# Patient Record
Sex: Female | Born: 1970 | Race: Black or African American | Hispanic: No | Marital: Single | Smoking: Former smoker
Health system: Southern US, Community
[De-identification: ages and names within clinical notes are randomized; demographics above are authoritative.]

## PROBLEM LIST (undated history)

## (undated) DIAGNOSIS — I10 Essential (primary) hypertension: Secondary | ICD-10-CM

## (undated) DIAGNOSIS — M069 Rheumatoid arthritis, unspecified: Secondary | ICD-10-CM

## (undated) HISTORY — DX: Rheumatoid arthritis, unspecified: M06.9

---

## 2011-04-21 ENCOUNTER — Emergency Department (INDEPENDENT_AMBULATORY_CARE_PROVIDER_SITE_OTHER): Payer: Self-pay

## 2011-04-21 ENCOUNTER — Emergency Department (HOSPITAL_BASED_OUTPATIENT_CLINIC_OR_DEPARTMENT_OTHER)
Admission: EM | Admit: 2011-04-21 | Discharge: 2011-04-21 | Disposition: A | Discharge status: Home / Self Care | Payer: Self-pay | Attending: Emergency Medicine | Admitting: Emergency Medicine

## 2011-04-21 ENCOUNTER — Encounter (HOSPITAL_BASED_OUTPATIENT_CLINIC_OR_DEPARTMENT_OTHER): Payer: Self-pay | Admitting: *Deleted

## 2011-04-21 DIAGNOSIS — M25579 Pain in unspecified ankle and joints of unspecified foot: Secondary | ICD-10-CM | POA: Insufficient documentation

## 2011-04-21 DIAGNOSIS — M6740 Ganglion, unspecified site: Secondary | ICD-10-CM | POA: Insufficient documentation

## 2011-04-21 DIAGNOSIS — M79609 Pain in unspecified limb: Secondary | ICD-10-CM

## 2011-04-21 DIAGNOSIS — M7989 Other specified soft tissue disorders: Secondary | ICD-10-CM

## 2011-04-21 HISTORY — DX: Essential (primary) hypertension: I10

## 2011-04-21 NOTE — ED Provider Notes (Signed)
History   This chart was scribed for Raeford Razor, MD by Melba Coon. The patient was seen in room [facility] and the patient's care was started at 3:53PM.    CSN: [id number]  Arrival date & time 04/21/11  1520   First MD Initiated Contact with Patient 04/21/11 1550      Chief Complaint  Patient presents with  . Foot Pain    (Consider location/radiation/quality/duration/timing/severity/associated sxs/prior treatment) HPI Jeanette Potts is a 41 y.o. female who presents to the Emergency Department complaining of constant, moderate to severe right foot pain pertaining to a developing mass. Knot on left foot has been there for a year; knot on right foot has also developed but last night was tender. Noticed when she was lotioning her foot. Feet have not undergone collision or hard contact. Pt hasn't seen a podiatrist or PCP for feet. No fever, chills or rash. Hx of HTN.   Past Medical History  Diagnosis Date  . Hypertension     History reviewed. No pertinent past surgical history.  History reviewed. No pertinent family history.  History  Substance Use Topics  . Smoking status: Current Everyday Smoker  . Smokeless tobacco: Not on file  . Alcohol Use: No    OB History    Grav Para Term Preterm Abortions TAB SAB Ect Mult Living                  Review of Systems 10 Systems reviewed and are negative for acute change except as noted in the HPI.  Allergies  Review of patient's allergies indicates no known allergies.  Home Medications   Current Outpatient Rx  Name Route Sig Dispense Refill  . ENALAPRIL MALEATE 10 MG PO TABS Oral Take 10 mg by mouth daily.      BP 152/95  Pulse 94  Temp(Src) 99.3 F (37.4 C) (Oral)  Resp 16  Ht 5\' 2"  (1.575 m)  Wt 150 lb (68.04 kg)  BMI 27.44 kg/m2  SpO2 100%  Physical Exam  Nursing note and vitals reviewed. Constitutional: She appears well-developed and well-nourished.       Awake, alert, nontoxic appearance.  HENT:    Head: Normocephalic and atraumatic.  Eyes: Conjunctivae and EOM are normal. Pupils are equal, round, and reactive to light. Right eye exhibits no discharge. Left eye exhibits no discharge.  Neck: Normal range of motion. Neck supple.  Cardiovascular: Normal rate, regular rhythm and normal heart sounds.   Pulmonary/Chest: Effort normal. She has wheezes (expiratory). She exhibits no tenderness.  Abdominal: Soft. There is no tenderness. There is no rebound.  Musculoskeletal: She exhibits no tenderness.       Baseline ROM, no obvious new focal weakness.  Neurological:       Mental status and motor strength appears baseline for patient and situation.  Skin: Skin is warm. No rash noted.       Dorsum of rt foot: 1 cm, mildly tender, soft, subcutaneous mass, not mobile, no overlying skin change, good DP pulse.   Arch of lft foot: 1 cm, non-tender, soft, subcutaneous mass, no overlying skin changes; consistent of ganglion cyst; neurovascularaly intact distally  Psychiatric: She has a normal mood and affect.    ED Course  Procedures (including critical care time) DIAGNOSTIC STUDIES: Oxygen Saturation is 100% on room air, normal by my interpretation.    COORDINATION OF CARE:  3:56PM - EDMD converses with pt that left foot likely has a ganglion cyst that is benign and not lethal; referral to  surgeon if needed; also EDMD gave smoking cessation talk.  Labs Reviewed - No data to display Dg Foot Complete Right  04/21/2011  *RADIOLOGY REPORT*  Clinical Data: Dorsal foot pain, no trauma  RIGHT FOOT COMPLETE - 3+ VIEW  Comparison: None.  Findings: No fracture or dislocation.  No radiopaque foreign body. There is a suggestion of a periarticular erosion at the head of the first metatarsal with overhanging margins suggesting seen with gouty arthropathy.  No calcified tophi are identified.  Mild dorsal soft tissue swelling is evident.  Mild degenerative changes are noted at the tarsometatarsal joint.   IMPRESSION: No acute fracture or dislocation.  Soft tissue swelling dorsal to the first tarsometatarsal joint with possible overhanging margins erosions at the head of the first metatarsal.  This may be seen with gouty arthropathy but is nonspecific.  If there is concern for osteomyelitis, consider MRI with contrast for further evaluation.  Original Report Authenticated By: Harrel Lemon, M.D.     1. Ganglion cyst       MDM  [phone] with lesion dorsum of R foot and plantar surface L foot. Suspect ganglion cyst. Doubt crystal arthropathy or infectious etiology. Ortho fu as needed.   I personally preformed the services scribed in my presence. The recorded information has been reviewed and considered. Raeford Razor, MD.         Raeford Razor, MD 04/21/11 [phone]

## 2011-04-21 NOTE — ED Notes (Signed)
Pt states she noticed a knot on her right foot after taking a bath. Denies injury.

## 2011-07-01 ENCOUNTER — Emergency Department (INDEPENDENT_AMBULATORY_CARE_PROVIDER_SITE_OTHER): Payer: Self-pay

## 2011-07-01 ENCOUNTER — Emergency Department (HOSPITAL_BASED_OUTPATIENT_CLINIC_OR_DEPARTMENT_OTHER)
Admission: EM | Admit: 2011-07-01 | Discharge: 2011-07-01 | Disposition: A | Discharge status: Home / Self Care | Payer: Self-pay | Attending: Emergency Medicine | Admitting: Emergency Medicine

## 2011-07-01 ENCOUNTER — Encounter (HOSPITAL_BASED_OUTPATIENT_CLINIC_OR_DEPARTMENT_OTHER): Payer: Self-pay | Admitting: *Deleted

## 2011-07-01 DIAGNOSIS — R0602 Shortness of breath: Secondary | ICD-10-CM

## 2011-07-01 DIAGNOSIS — R000 Tachycardia, unspecified: Secondary | ICD-10-CM | POA: Insufficient documentation

## 2011-07-01 DIAGNOSIS — R091 Pleurisy: Secondary | ICD-10-CM | POA: Insufficient documentation

## 2011-07-01 DIAGNOSIS — R079 Chest pain, unspecified: Secondary | ICD-10-CM | POA: Insufficient documentation

## 2011-07-01 DIAGNOSIS — E119 Type 2 diabetes mellitus without complications: Secondary | ICD-10-CM | POA: Insufficient documentation

## 2011-07-01 DIAGNOSIS — I10 Essential (primary) hypertension: Secondary | ICD-10-CM

## 2011-07-01 DIAGNOSIS — M549 Dorsalgia, unspecified: Secondary | ICD-10-CM | POA: Insufficient documentation

## 2011-07-01 DIAGNOSIS — R799 Abnormal finding of blood chemistry, unspecified: Secondary | ICD-10-CM

## 2011-07-01 DIAGNOSIS — K449 Diaphragmatic hernia without obstruction or gangrene: Secondary | ICD-10-CM

## 2011-07-01 LAB — COMPREHENSIVE METABOLIC PANEL
AST: 16 U/L (ref 0–37)
Albumin: 3.8 g/dL (ref 3.5–5.2)
Calcium: 9.2 mg/dL (ref 8.4–10.5)
Creatinine, Ser: 0.7 mg/dL (ref 0.50–1.10)
GFR calc non Af Amer: >90 mL/min (ref >90)

## 2011-07-01 LAB — PROTIME-INR: INR: 0.96 (ref 0.00–1.49)

## 2011-07-01 LAB — APTT: aPTT: 30 seconds (ref 24–37)

## 2011-07-01 LAB — CBC
MCH: 28.7 pg (ref 26.0–34.0)
MCHC: 35.9 g/dL (ref 30.0–36.0)
MCV: 79.8 fL (ref 78.0–100.0)
Platelets: 303 10*3/uL (ref 150–400)
RDW: 12.8 % (ref 11.5–15.5)

## 2011-07-01 LAB — TROPONIN I: Troponin I: <0.3 ng/mL (ref <0.30)

## 2011-07-01 MED ORDER — ENALAPRIL MALEATE 10 MG PO TABS: 10.0000 mg | ORAL_TABLET | Freq: Every day | ORAL | Status: DC

## 2011-07-01 MED ORDER — IOHEXOL 350 MG/ML SOLN
80.0000 mL | Freq: Once | INTRAVENOUS | Status: AC | PRN
  Administered 2011-07-01: 80 mL via INTRAVENOUS

## 2011-07-01 MED ORDER — SODIUM CHLORIDE 0.9 % IV SOLN: 20.0000 mL | INTRAVENOUS | Status: DC

## 2011-07-01 MED ORDER — AZITHROMYCIN 250 MG PO TABS: ORAL_TABLET | ORAL | Status: AC

## 2011-07-01 MED ORDER — HYDROCODONE-ACETAMINOPHEN 5-325 MG PO TABS: 1.0000 | ORAL_TABLET | Freq: Four times a day (QID) | ORAL | Status: AC | PRN

## 2011-07-01 NOTE — ED Notes (Signed)
Tightness in her right chest x 4 days. Pain radiates into her back. Feels like she is breathing through a straw.

## 2011-07-01 NOTE — Discharge Instructions (Signed)
Pneumonia, Adult Pneumonia is an infection of the lungs.  CAUSES Pneumonia may be caused by bacteria or a virus. Usually, these infections are caused by breathing infectious particles into the lungs (respiratory tract). SYMPTOMS   Cough.   Fever.   Chest pain.   Increased rate of breathing.   Wheezing.   Mucus production.  DIAGNOSIS  If you have the common symptoms of pneumonia, your caregiver will typically confirm the diagnosis with a chest X-ray. The X-ray will show an abnormality in the lung (pulmonary infiltrate) if you have pneumonia. Other tests of your blood, urine, or sputum may be done to find the specific cause of your pneumonia. Your caregiver may also do tests (blood gases or pulse oximetry) to see how well your lungs are working. TREATMENT  Some forms of pneumonia may be spread to other people when you cough or sneeze. You may be asked to wear a mask before and during your exam. Pneumonia that is caused by bacteria is treated with antibiotic medicine. Pneumonia that is caused by the influenza virus may be treated with an antiviral medicine. Most other viral infections must run their course. These infections will not respond to antibiotics.  PREVENTION A pneumococcal shot (vaccine) is available to prevent a common bacterial cause of pneumonia. This is usually suggested for:  People over 65 years old.   Patients on chemotherapy.   People with chronic lung problems, such as bronchitis or emphysema.   People with immune system problems.  If you are over 65 or have a high risk condition, you may receive the pneumococcal vaccine if you have not received it before. In some countries, a routine influenza vaccine is also recommended. This vaccine can help prevent some cases of pneumonia.You may be offered the influenza vaccine as part of your care. If you smoke, it is time to quit. You may receive instructions on how to stop smoking. Your caregiver can provide medicines and  counseling to help you quit. HOME CARE INSTRUCTIONS   Cough suppressants may be used if you are losing too much rest. However, coughing protects you by clearing your lungs. You should avoid using cough suppressants if you can.   Your caregiver may have prescribed medicine if he or she thinks your pneumonia is caused by a bacteria or influenza. Finish your medicine even if you start to feel better.   Your caregiver may also prescribe an expectorant. This loosens the mucus to be coughed up.   Only take over-the-counter or prescription medicines for pain, discomfort, or fever as directed by your caregiver.   Do not smoke. Smoking is a common cause of bronchitis and can contribute to pneumonia. If you are a smoker and continue to smoke, your cough may last several weeks after your pneumonia has cleared.   A cold steam vaporizer or humidifier in your room or home may help loosen mucus.   Coughing is often worse at night. Sleeping in a semi-upright position in a recliner or using a couple pillows under your head will help with this.   Get rest as you feel it is needed. Your body will usually let you know when you need to rest.  SEEK IMMEDIATE MEDICAL CARE IF:   Your illness becomes worse. This is especially true if you are elderly or weakened from any other disease.   You cannot control your cough with suppressants and are losing sleep.   You begin coughing up blood.   You develop pain which is getting worse or   is uncontrolled with medicines.   You have a fever.   Any of the symptoms which initially brought you in for treatment are getting worse rather than better.   You develop shortness of breath or chest pain.  MAKE SURE YOU:   Understand these instructions.   Will watch your condition.   Will get help right away if you are not doing well or get worse.  Document Released: 03/04/2005 Document Revised: 02/21/2011 Document Reviewed: 05/24/2010 [facility] Patient Information 2012  [place], [place].Arterial Hypertension Arterial hypertension (high blood pressure) is a condition of elevated pressure in your blood vessels. Hypertension over a long period of time is a risk factor for strokes, heart attacks, and heart failure. It is also the leading cause of kidney (renal) failure.  CAUSES   In Adults -- Over 90% of all hypertension has no known cause. This is called essential or primary hypertension. In the other 10% of people with hypertension, the increase in blood pressure is caused by another disorder. This is called secondary hypertension. Important causes of secondary hypertension are:   Heavy alcohol use.   Obstructive sleep apnea.   Hyperaldosterosim (Conn's syndrome).   Steroid use.   Chronic kidney failure.   Hyperparathyroidism.   Medications.   Renal artery stenosis.   Pheochromocytoma.   Cushing's disease.   Coarctation of the aorta.   Scleroderma renal crisis.   Licorice (in excessive amounts).   Drugs (cocaine, methamphetamine).  Your caregiver can explain any items above that apply to you.  In Children -- Secondary hypertension is more common and should always be considered.   Pregnancy -- Few women of childbearing age have high blood pressure. However, up to 10% of them develop hypertension of pregnancy. Generally, this will not harm the woman. It may be a sign of 3 complications of pregnancy: preeclampsia, HELLP syndrome, and eclampsia. Follow up and control with medication is necessary.  SYMPTOMS   This condition normally does not produce any noticeable symptoms. It is usually found during a routine exam.   Malignant hypertension is a late problem of high blood pressure. It may have the following symptoms:   Headaches.   Blurred vision.   End-organ damage (this means your kidneys, heart, lungs, and other organs are being damaged).   Stressful situations can increase the blood pressure. If a person with normal blood pressure has  their blood pressure go up while being seen by their caregiver, this is often termed "white coat hypertension." Its importance is not known. It may be related with eventually developing hypertension or complications of hypertension.   Hypertension is often confused with mental tension, stress, and anxiety.  DIAGNOSIS  The diagnosis is made by 3 separate blood pressure measurements. They are taken at least 1 week apart from each other. If there is organ damage from hypertension, the diagnosis may be made without repeat measurements. Hypertension is usually identified by having blood pressure readings:  Above 140/90 mmHg measured in both arms, at 3 separate times, over a couple weeks.   Over 130/80 mmHg should be considered a risk factor and may require treatment in patients with diabetes.  Blood pressure readings over 120/80 mmHg are called "pre-hypertension" even in non-diabetic patients. To get a true blood pressure measurement, use the following guidelines. Be aware of the factors that can alter blood pressure readings.  Take measurements at least 1 hour after caffeine.   Take measurements 30 minutes after smoking and without any stress. This is another reason to quit smoking -  it raises your blood pressure.   Use a proper cuff size. Ask your caregiver if you are not sure about your cuff size.   Most home blood pressure cuffs are automatic. They will measure systolic and diastolic pressures. The systolic pressure is the pressure reading at the start of sounds. Diastolic pressure is the pressure at which the sounds disappear. If you are elderly, measure pressures in multiple postures. Try sitting, lying or standing.   Sit at rest for a minimum of 5 minutes before taking measurements.   You should not be on any medications like decongestants. These are found in many cold medications.   Record your blood pressure readings and review them with your caregiver.  If you have  hypertension:  Your caregiver may do tests to be sure you do not have secondary hypertension (see "causes" above).   Your caregiver may also look for signs of metabolic syndrome. This is also called Syndrome X or Insulin Resistance Syndrome. You may have this syndrome if you have type 2 diabetes, abdominal obesity, and abnormal blood lipids in addition to hypertension.   Your caregiver will take your medical and family history and perform a physical exam.   Diagnostic tests may include blood tests (for glucose, cholesterol, potassium, and kidney function), a urinalysis, or an EKG. Other tests may also be necessary depending on your condition.  PREVENTION  There are important lifestyle issues that you can adopt to reduce your chance of developing hypertension:  Maintain a normal weight.   Limit the amount of salt (sodium) in your diet.   Exercise often.   Limit alcohol intake.   Get enough potassium in your diet. Discuss specific advice with your caregiver.   Follow a DASH diet (dietary approaches to stop hypertension). This diet is rich in fruits, vegetables, and low-fat dairy products, and avoids certain fats.  PROGNOSIS  Essential hypertension cannot be cured. Lifestyle changes and medical treatment can lower blood pressure and reduce complications. The prognosis of secondary hypertension depends on the underlying cause. Many people whose hypertension is controlled with medicine or lifestyle changes can live a normal, healthy life.  RISKS AND COMPLICATIONS  While high blood pressure alone is not an illness, it often requires treatment due to its short- and long-term effects on many organs. Hypertension increases your risk for:  CVAs or strokes (cerebrovascular accident).   Heart failure due to chronically high blood pressure (hypertensive cardiomyopathy).   Heart attack (myocardial infarction).   Damage to the retina (hypertensive retinopathy).   Kidney failure (hypertensive  nephropathy).  Your caregiver can explain list items above that apply to you. Treatment of hypertension can significantly reduce the risk of complications. TREATMENT   For overweight patients, weight loss and regular exercise are recommended. Physical fitness lowers blood pressure.   Mild hypertension is usually treated with diet and exercise. A diet rich in fruits and vegetables, fat-free dairy products, and foods low in fat and salt (sodium) can help lower blood pressure. Decreasing salt intake decreases blood pressure in a 1/3 of people.   Stop smoking if you are a smoker.  The steps above are highly effective in reducing blood pressure. While these actions are easy to suggest, they are difficult to achieve. Most patients with moderate or severe hypertension end up requiring medications to bring their blood pressure down to a normal level. There are several classes of medications for treatment. Blood pressure pills (antihypertensives) will lower blood pressure by their different actions. Lowering the blood pressure by  10 mmHg may decrease the risk of complications by as much as 25%. The goal of treatment is effective blood pressure control. This will reduce your risk for complications. Your caregiver will help you determine the best treatment for you according to your lifestyle. What is excellent treatment for one person, may not be for you. HOME CARE INSTRUCTIONS   Do not smoke.   Follow the lifestyle changes outlined in the "Prevention" section.   If you are on medications, follow the directions carefully. Blood pressure medications must be taken as prescribed. Skipping doses reduces their benefit. It also puts you at risk for problems.   Follow up with your caregiver, as directed.   If you are asked to monitor your blood pressure at home, follow the guidelines in the "Diagnosis" section above.  SEEK MEDICAL CARE IF:   You think you are having medication side effects.   You have  recurrent headaches or lightheadedness.   You have swelling in your ankles.   You have trouble with your vision.  SEEK IMMEDIATE MEDICAL CARE IF:   You have sudden onset of chest pain or pressure, difficulty breathing, or other symptoms of a heart attack.   You have a severe headache.   You have symptoms of a stroke (such as sudden weakness, difficulty speaking, difficulty walking).  MAKE SURE YOU:   Understand these instructions.   Will watch your condition.   Will get help right away if you are not doing well or get worse.  Document Released: 03/04/2005 Document Revised: 02/21/2011 Document Reviewed: 10/02/2006 [facility] Patient Information 2012 [place], [place].

## 2011-07-01 NOTE — ED Provider Notes (Signed)
This chart was scribed for Jeanette Kras, MD by [name]. The patient was seen in room MH02/MH02 and the patient's care was started at 6:10 PM.   CSN: [id number]  Arrival date & time 07/01/11  1743  First MD Initiated Contact with Patient 07/01/11 1759     HPI  Jeanette Potts is a 41 y.o. female who presents to the Emergency Department complaining of Gradual onset, persistence of constant, gradually worsening, moderate  right sided chest pain onset 4 days ago. The CP radiates to her back. Describes the pain as dull and tightness, states that it feels like she is breathing through a straw. Pt w/ DM, ganglion cysts on her feet. Denies blood clot, any recent plane rides or trips. Denies any h/o heart problems. There are no other associated symptoms and no other alleviating or aggravating factors.  Past Medical History  Diagnosis Date  . Hypertension   . Diabetes mellitus     History reviewed. No pertinent past surgical history.  No family history on file.  History  Substance Use Topics  . Smoking status: Current Everyday Smoker  . Smokeless tobacco: Not on file  . Alcohol Use: No    OB History    Grav Para Term Preterm Abortions TAB SAB Ect Mult Living                  Review of Systems  Respiratory: Positive for chest tightness.   Cardiovascular: Positive for chest pain.  Musculoskeletal: Positive for back pain.  All other systems reviewed and are negative.    Allergies  Review of patient's allergies indicates no known allergies.  Home Medications   Current Outpatient Rx  Name Route Sig Dispense Refill  . ACETAMINOPHEN 325 MG PO TABS Oral Take 650 mg by mouth every 6 (six) hours as needed. Patient used this medication for the pain in her chest.    . CALCIUM CARBONATE 1250 MG PO CHEW Oral Chew 1 tablet by mouth daily. Patient has been using this medication for heartburn.    . ENALAPRIL MALEATE 10 MG PO TABS Oral Take 10 mg by mouth daily.      BP 173/96   Pulse 96  Temp(Src) 98.1 F (36.7 C) (Oral)  Resp 20  SpO2 100%  Physical Exam  Nursing note and vitals reviewed. Constitutional: She appears well-developed and well-nourished. No distress.  HENT:  Head: Normocephalic and atraumatic.  Right Ear: External ear normal.  Left Ear: External ear normal.  Eyes: Conjunctivae are normal. Right eye exhibits no discharge. Left eye exhibits no discharge. No scleral icterus.  Neck: Neck supple. No tracheal deviation present.  Cardiovascular: Regular rhythm and intact distal pulses.  Tachycardia present.   Pulmonary/Chest: Effort normal and breath sounds normal. No stridor. No respiratory distress. She has no wheezes. She has no rales.       Mild chest wall tenderness right side   Abdominal: Soft. Bowel sounds are normal. She exhibits no distension. There is no tenderness. There is no rebound and no guarding.  Musculoskeletal: She exhibits no edema and no tenderness.  Neurological: She is alert. She has normal strength. No sensory deficit. Cranial nerve deficit:  no gross defecits noted. She exhibits normal muscle tone. She displays no seizure activity. Coordination normal.  Skin: Skin is warm and dry. No rash noted.  Psychiatric: She has a normal mood and affect.    ED Course  Procedures (including critical care time) DIAGNOSTIC STUDIES: Oxygen Saturation is 100% on room air,  normal by my interpretation.    COORDINATION OF CARE:  9:27 PM: EDP at bedside, All results reviewed and discussed with pt, questions answered, pt agreeable with plan.  Rate: 112  Rhythm: Sinus tachycardia  QRS Axis: normal  Intervals: normal  ST/T Wave abnormalities: normal  Conduction Disutrbances:none  Narrative Interpretation:   Old EKG Reviewed: none available   Labs Reviewed  CBC - Abnormal; Notable for the following:    RBC 5.30 (*)    Hemoglobin 15.2 (*)    All other components within normal limits  D-DIMER, QUANTITATIVE - Abnormal; Notable for the  following:    D-Dimer, Quant 1.68 (*)    All other components within normal limits  COMPREHENSIVE METABOLIC PANEL  PROTIME-INR  APTT  TROPONIN I   Dg Chest 2 View  07/01/2011  *RADIOLOGY REPORT*  Clinical Data: Right side chest pain.  Shortness of breath.  CHEST - 2 VIEW  Comparison: None.  Findings: The lungs are clear.  Heart size is normal.  No pneumothorax or effusion.  No focal bony abnormality.  IMPRESSION: No acute disease.  Original Report Authenticated By: Bernadene Bell. Maricela Curet, M.D.   Ct Angio Chest W/cm &/or Wo Cm  07/01/2011  *RADIOLOGY REPORT*  Clinical Data: Chest pain.  Right-sided chest pain with some shortness of breath and elevated D-dimer.  CT ANGIOGRAPHY CHEST  Technique:  Multidetector CT imaging of the chest using the standard protocol during bolus administration of intravenous contrast. Multiplanar reconstructed images including MIPs were obtained and reviewed to evaluate the vascular anatomy.  Contrast: 80mL OMNIPAQUE IOHEXOL 350 MG/ML SOLN  Comparison: Chest radiograph 07/01/2011  Findings: This is a satisfactory evaluation of the pulmonary arterial tree.  No focal filling defects are identified in the pulmonary arteries to suggest pulmonary embolism.  Heart size is normal.  The thoracic aorta is normal in caliber and demonstrates normal enhancement.  Negative for dissection.  There is a small hiatal hernia.  Negative for lymphadenopathy, pleural effusion, or pericardial effusion.  Imaged soft tissues of the breasts are unremarkable.  The trachea and mainstem bronchi are patent.  The lungs are well expanded.  Small faint patchy areas of airspace disease are seen in the right upper lobe, most prominent near the hilum.  Smaller patchy area of airspace disease is noted in the right lower lobe laterally.  There is no associated consolidation or cavitary change.  The left lung is clear.  The bony thorax is intact.  IMPRESSION: 1.  Negative for pulmonary embolism.  2.  Faint small patchy  areas of airspace disease in the right upper lobe, greater than the right lower lobe.  The appearances are nonspecific, but suspicious for early changes of infection.  Given their very faint nature, these opacities are not visible on the scout radiograph of the chest CT.  3.  Small hiatal hernia.  Original Report Authenticated By: Britta Mccreedy, M.D.     1. Chest pain   2. Pleurisy   3. Hypertension       MDM  Patient does not have a pulmonary embolism per the CT scan. There is question of airspace disease in the right upper lobe and right lower lobe. It is possible this could be an early pneumonia. I will discharge the patient home on a prescription of azithromycin. She also requested a refill of her blood pressure medications. The symptoms are not suggestive of a cardiac etiology. I discussed the findings with the patient and she is comfortable with outpatient followup. I discussed the  importance of following up with a primary care Dr.   I personally performed the services described in this documentation, which was scribed in my presence.  The recorded information has been reviewed and considered.       Jeanette Kras, MD 07/01/11 2135

## 2012-04-29 ENCOUNTER — Encounter (HOSPITAL_BASED_OUTPATIENT_CLINIC_OR_DEPARTMENT_OTHER): Payer: Self-pay

## 2012-04-29 ENCOUNTER — Emergency Department (HOSPITAL_BASED_OUTPATIENT_CLINIC_OR_DEPARTMENT_OTHER)
Admission: EM | Admit: 2012-04-29 | Discharge: 2012-04-29 | Disposition: A | Discharge status: Home / Self Care | Payer: Self-pay | Attending: Emergency Medicine | Admitting: Emergency Medicine

## 2012-04-29 DIAGNOSIS — R112 Nausea with vomiting, unspecified: Secondary | ICD-10-CM | POA: Insufficient documentation

## 2012-04-29 DIAGNOSIS — F17200 Nicotine dependence, unspecified, uncomplicated: Secondary | ICD-10-CM | POA: Insufficient documentation

## 2012-04-29 DIAGNOSIS — R6883 Chills (without fever): Secondary | ICD-10-CM | POA: Insufficient documentation

## 2012-04-29 DIAGNOSIS — E119 Type 2 diabetes mellitus without complications: Secondary | ICD-10-CM | POA: Insufficient documentation

## 2012-04-29 DIAGNOSIS — R5381 Other malaise: Secondary | ICD-10-CM | POA: Insufficient documentation

## 2012-04-29 DIAGNOSIS — B349 Viral infection, unspecified: Secondary | ICD-10-CM

## 2012-04-29 DIAGNOSIS — B9789 Other viral agents as the cause of diseases classified elsewhere: Secondary | ICD-10-CM | POA: Insufficient documentation

## 2012-04-29 DIAGNOSIS — I10 Essential (primary) hypertension: Secondary | ICD-10-CM | POA: Insufficient documentation

## 2012-04-29 DIAGNOSIS — Z79899 Other long term (current) drug therapy: Secondary | ICD-10-CM | POA: Insufficient documentation

## 2012-04-29 MED ORDER — PROMETHAZINE HCL 25 MG PO TABS: 25.0000 mg | ORAL_TABLET | Freq: Four times a day (QID) | ORAL | Status: DC | PRN

## 2012-04-29 MED ORDER — ONDANSETRON HCL 4 MG/2ML IJ SOLN
4.0000 mg | Freq: Once | INTRAMUSCULAR | Status: AC
  Administered 2012-04-29: 4 mg via INTRAVENOUS
  Filled 2012-04-29: qty 2

## 2012-04-29 MED ORDER — KETOROLAC TROMETHAMINE 30 MG/ML IJ SOLN
30.0000 mg | Freq: Once | INTRAMUSCULAR | Status: AC
  Administered 2012-04-29: 30 mg via INTRAVENOUS
  Filled 2012-04-29: qty 1

## 2012-04-29 MED ORDER — SODIUM CHLORIDE 0.9 % IV BOLUS (SEPSIS)
1000.0000 mL | Freq: Once | INTRAVENOUS | Status: AC
  Administered 2012-04-29: 1000 mL via INTRAVENOUS

## 2012-04-29 NOTE — ED Notes (Signed)
Pt states, "I feel like I have been ran over by a truck".  Pt states that she has nausea, no vomiting, body aches, chills.  Taking [employer] Cold and Flu Powders.

## 2012-04-29 NOTE — ED Notes (Signed)
Pt's fluids are still infusing.  Pt encouraged to keep hand straight so that fluids could infuse.  Warm blanket given.

## 2012-04-29 NOTE — ED Provider Notes (Signed)
History     CSN: [id number]  Arrival date & time 04/29/12  1000   First MD Initiated Contact with Patient 04/29/12 1018      Chief Complaint  Patient presents with  . Flu-Like Symptoms     (Consider location/radiation/quality/duration/timing/severity/associated sxs/prior treatment) HPI Comments: Patient with three day history of n/v, body aches, chills, cough that is non-productive.  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  Patient is a 42 y.o. female presenting with cough. The history is provided by the patient.  Cough Cough characteristics:  Non-productive Severity:  Moderate Timing:  Constant Progression:  Worsening Chronicity:  New Context: sick contacts   Relieved by:  Nothing Worsened by:  Nothing tried Associated symptoms: chills   Associated symptoms: no fever     Past Medical History  Diagnosis Date  . Hypertension   . Diabetes mellitus     History reviewed. No pertinent past surgical history.  History reviewed. No pertinent family history.  History  Substance Use Topics  . Smoking status: Current Some Day Smoker    Types: Cigarettes  . Smokeless tobacco: Never Used  . Alcohol Use: No    OB History   Grav Para Term Preterm Abortions TAB SAB Ect Mult Living                  Review of Systems  Constitutional: Positive for chills and fatigue. Negative for fever.  Respiratory: Positive for cough.   All other systems reviewed and are negative.    Allergies  Review of patient's allergies indicates no known allergies.  Home Medications   Current Outpatient Rx  Name  Route  Sig  Dispense  Refill  . enalapril (VASOTEC) 10 MG tablet   Oral   Take 10 mg by mouth daily.         Marland Kitchen acetaminophen (TYLENOL) 325 MG tablet   Oral   Take 650 mg by mouth every 6 (six) hours as needed. Patient used this medication for the pain in her chest.         . calcium carbonate (OS-CAL) 1250 MG chewable tablet   Oral   Chew 1  tablet by mouth daily. Patient has been using this medication for heartburn.         . enalapril (VASOTEC) 10 MG tablet   Oral   Take 1 tablet (10 mg total) by mouth daily.   30 tablet   0     BP 158/112  Pulse 111  Temp(Src) 98.3 F (36.8 C) (Oral)  Ht 5\' 3"  (1.6 m)  Wt 160 lb (72.576 kg)  BMI 28.35 kg/m2  SpO2 100%  Physical Exam  Nursing note and vitals reviewed. Constitutional: She is oriented to person, place, and time. She appears well-developed and well-nourished. No distress.  HENT:  Head: Normocephalic and atraumatic.  Neck: Normal range of motion. Neck supple.  Cardiovascular: Normal rate and regular rhythm.  Exam reveals no gallop and no friction rub.   No murmur heard. Pulmonary/Chest: Effort normal and breath sounds normal. No respiratory distress. She has no wheezes.  Abdominal: Soft. Bowel sounds are normal. She exhibits no distension. There is no tenderness.  Musculoskeletal: Normal range of motion.  Neurological: She is alert and oriented to person, place, and time.  Skin: Skin is warm and dry. She is not diaphoretic.    ED Course  Procedures (including critical care time)  Labs Reviewed - No data to display No results found.  No diagnosis found.    MDM  Symptoms likely viral in nature.  Will discharge to home with phenergan, motrin prn.  Return as needed if worsens.        Geoffery Lyons, MD 04/29/12 1128

## 2012-04-29 NOTE — ED Notes (Signed)
Attempted piv x2 attemtps, unsuccessful, another RN to try.

## 2012-11-24 ENCOUNTER — Emergency Department (HOSPITAL_BASED_OUTPATIENT_CLINIC_OR_DEPARTMENT_OTHER)
Admission: EM | Admit: 2012-11-24 | Discharge: 2012-11-24 | Disposition: A | Discharge status: Home / Self Care | Payer: Self-pay | Attending: Emergency Medicine | Admitting: Emergency Medicine

## 2012-11-24 ENCOUNTER — Encounter (HOSPITAL_BASED_OUTPATIENT_CLINIC_OR_DEPARTMENT_OTHER): Payer: Self-pay | Admitting: *Deleted

## 2012-11-24 DIAGNOSIS — M65839 Other synovitis and tenosynovitis, unspecified forearm: Secondary | ICD-10-CM | POA: Insufficient documentation

## 2012-11-24 DIAGNOSIS — F17200 Nicotine dependence, unspecified, uncomplicated: Secondary | ICD-10-CM | POA: Insufficient documentation

## 2012-11-24 DIAGNOSIS — Z79899 Other long term (current) drug therapy: Secondary | ICD-10-CM | POA: Insufficient documentation

## 2012-11-24 DIAGNOSIS — I10 Essential (primary) hypertension: Secondary | ICD-10-CM | POA: Insufficient documentation

## 2012-11-24 DIAGNOSIS — M778 Other enthesopathies, not elsewhere classified: Secondary | ICD-10-CM

## 2012-11-24 DIAGNOSIS — E119 Type 2 diabetes mellitus without complications: Secondary | ICD-10-CM | POA: Insufficient documentation

## 2012-11-24 LAB — GLUCOSE, CAPILLARY: Glucose-Capillary: 106 mg/dL — ABNORMAL HIGH (ref 70–99)

## 2012-11-24 MED ORDER — LISINOPRIL 10 MG PO TABS: 10.0000 mg | ORAL_TABLET | Freq: Every day | ORAL | Status: DC

## 2012-11-24 MED ORDER — NAPROXEN 500 MG PO TABS: 500.0000 mg | ORAL_TABLET | Freq: Two times a day (BID) | ORAL | Status: DC

## 2012-11-24 NOTE — ED Notes (Signed)
Patient c/o L wrist pain for the past week

## 2012-11-24 NOTE — ED Provider Notes (Signed)
CSN: [id number]     Arrival date & time 11/24/12  1048 History   First MD Initiated Contact with Patient 11/24/12 1048     Chief Complaint  Patient presents with  . Wrist Pain   (Consider location/radiation/quality/duration/timing/severity/associated sxs/prior Treatment) Patient is a 42 y.o. female presenting with wrist pain.  Wrist Pain   Pt reports about a week of moderate to severe aching L wrist pain, mostly on the ulnar side and radiating up her arm toward the elbow. No numbness or tingling. Pain worse with movement. 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 She states work provider gave her motrin and an ace wrap. Which have not helped. She is out of her BP and DM meds for about a month, previously going to clinic in [facility].   Past Medical History  Diagnosis Date  . Hypertension   . Diabetes mellitus    No past surgical history on file. No family history on file. History  Substance Use Topics  . Smoking status: Current Some Day Smoker    Types: Cigarettes  . Smokeless tobacco: Never Used  . Alcohol Use: No   OB History   Grav Para Term Preterm Abortions TAB SAB Ect Mult Living                 Review of Systems All other systems reviewed and are negative except as noted in HPI.   Allergies  Review of patient's allergies indicates no known allergies.  Home Medications   Current Outpatient Rx  Name  Route  Sig  Dispense  Refill  . lisinopril (PRINIVIL,ZESTRIL) 10 MG tablet   Oral   Take 10 mg by mouth daily.         Marland Kitchen METFORMIN HCL PO   Oral   Take 500 mg by mouth daily.         Marland Kitchen acetaminophen (TYLENOL) 325 MG tablet   Oral   Take 650 mg by mouth every 6 (six) hours as needed. Patient used this medication for the pain in her chest.         . calcium carbonate (OS-CAL) 1250 MG chewable tablet   Oral   Chew 1 tablet by mouth daily. Patient has been using this medication for heartburn.         . enalapril (VASOTEC) 10 MG  tablet   Oral   Take 10 mg by mouth daily.         Marland Kitchen EXPIRED: enalapril (VASOTEC) 10 MG tablet   Oral   Take 1 tablet (10 mg total) by mouth daily.   30 tablet   0   . promethazine (PHENERGAN) 25 MG tablet   Oral   Take 1 tablet (25 mg total) by mouth every 6 (six) hours as needed for nausea.   10 tablet   0    BP 176/116  Pulse 94  Temp(Src) 98.1 F (36.7 C) (Oral)  Resp 16  SpO2 100% Physical Exam  Nursing note and vitals reviewed. Constitutional: She is oriented to person, place, and time. She appears well-developed and well-nourished.  HENT:  Head: Normocephalic and atraumatic.  Eyes: EOM are normal. Pupils are equal, round, and reactive to light.  Neck: Normal range of motion. Neck supple.  Cardiovascular: Normal rate, normal heart sounds and intact distal pulses.   Pulmonary/Chest: Effort normal and breath sounds normal.  Abdominal: Bowel sounds are normal. She exhibits no distension. There is no tenderness.  Musculoskeletal: She exhibits  tenderness (L wrist soft tissue, no bony tenderness). She exhibits no edema.  Decreased ROM due to pain  Neurological: She is alert and oriented to person, place, and time. She has normal strength. No cranial nerve deficit or sensory deficit.  Skin: Skin is warm and dry. No rash noted.  Psychiatric: She has a normal mood and affect.    ED Course  Procedures (including critical care time) Labs Review Labs Reviewed  GLUCOSE, CAPILLARY - Abnormal; Notable for the following:    Glucose-Capillary 106 (*)    All other components within normal limits   Imaging Review No results found.  MDM   1. Tendonitis of wrist, left   2. Hypertension     Suspect carpal tunnel vs wrist tendonitis from overuse. Advised wrist splint, NSAIDs, rest and Hand followup. CBG checked and not elevated eve though she just at lunch. Will restart BP meds but no DM meds. Advised PCP followup.    Jaquilla Woodroof B. Bernette Mayers, MD 11/24/12 1105

## 2013-10-06 ENCOUNTER — Telehealth: Payer: Self-pay | Admitting: Obstetrics & Gynecology

## 2013-10-06 NOTE — Telephone Encounter (Signed)
Patient was referred here from [facility] to get her IUD removed. Patient is uninsured. I informed the patient that without insurance she would be required to pay $120. She stated she was not ready to pay that, and would call us back when she was ready to schedule. Her paper work is in the scan folder.

## 2013-10-13 ENCOUNTER — Encounter: Payer: Self-pay | Admitting: *Deleted

## 2014-05-18 ENCOUNTER — Emergency Department (HOSPITAL_BASED_OUTPATIENT_CLINIC_OR_DEPARTMENT_OTHER)
Admission: EM | Admit: 2014-05-18 | Discharge: 2014-05-18 | Disposition: A | Discharge status: Home / Self Care | Payer: Self-pay | Attending: Emergency Medicine | Admitting: Emergency Medicine

## 2014-05-18 ENCOUNTER — Encounter (HOSPITAL_BASED_OUTPATIENT_CLINIC_OR_DEPARTMENT_OTHER): Payer: Self-pay

## 2014-05-18 DIAGNOSIS — Z791 Long term (current) use of non-steroidal anti-inflammatories (NSAID): Secondary | ICD-10-CM | POA: Insufficient documentation

## 2014-05-18 DIAGNOSIS — B001 Herpesviral vesicular dermatitis: Secondary | ICD-10-CM | POA: Insufficient documentation

## 2014-05-18 DIAGNOSIS — Z79899 Other long term (current) drug therapy: Secondary | ICD-10-CM | POA: Insufficient documentation

## 2014-05-18 DIAGNOSIS — I10 Essential (primary) hypertension: Secondary | ICD-10-CM | POA: Insufficient documentation

## 2014-05-18 DIAGNOSIS — Z720 Tobacco use: Secondary | ICD-10-CM | POA: Insufficient documentation

## 2014-05-18 DIAGNOSIS — E119 Type 2 diabetes mellitus without complications: Secondary | ICD-10-CM | POA: Insufficient documentation

## 2014-05-18 MED ORDER — ACYCLOVIR 400 MG PO TABS: 400.0000 mg | ORAL_TABLET | Freq: Every day | ORAL | Status: DC

## 2014-05-18 NOTE — ED Provider Notes (Signed)
CSN: [id number]     Arrival date & time 05/18/14  0720 History   First MD Initiated Contact with Patient 05/18/14 0732     Chief Complaint  Patient presents with  . Mouth Lesions     (Consider location/radiation/quality/duration/timing/severity/associated sxs/prior Treatment) HPI The patient reports that she has a fever blister to her lower lip. She reports it just came up overnight. She reports she did have one about a week ago that resolved but this one is bigger and more painful. She reports that she's had them in the past and has taken medication for them in the past that was helpful. No sore throat or difficulty swallowing. Past Medical History  Diagnosis Date  . Hypertension   . Diabetes mellitus    History reviewed. No pertinent past surgical history. No family history on file. History  Substance Use Topics  . Smoking status: Current Some Day Smoker    Types: Cigarettes  . Smokeless tobacco: Never Used  . Alcohol Use: No   OB History    No data available     Review of Systems  Constitutional: No documented fever or chills. Eyes: No eye pain, lesions, drainage or visual difficulty. Allergies  Review of patient's allergies indicates no known allergies.  Home Medications   Prior to Admission medications   Medication Sig Start Date End Date Taking? Authorizing Provider  metFORMIN (GLUCOPHAGE) 500 MG tablet Take by mouth 2 (two) times daily with a meal.   Yes Historical Provider, MD  acetaminophen (TYLENOL) 325 MG tablet Take 650 mg by mouth every 6 (six) hours as needed. Patient used this medication for the pain in her chest.    Historical Provider, MD  acyclovir (ZOVIRAX) 400 MG tablet Take 1 tablet (400 mg total) by mouth 5 (five) times daily. 05/18/14   Arby Barrette, MD  calcium carbonate (OS-CAL) 1250 MG chewable tablet Chew 1 tablet by mouth daily. Patient has been using this medication for heartburn.    Historical Provider, MD  lisinopril (PRINIVIL,ZESTRIL) 10 MG  tablet Take 1 tablet (10 mg total) by mouth daily. 11/24/12   Charles B. Bernette Mayers, MD  naproxen (NAPROSYN) 500 MG tablet Take 1 tablet (500 mg total) by mouth 2 (two) times daily. 11/24/12   Charles B. Bernette Mayers, MD  promethazine (PHENERGAN) 25 MG tablet Take 1 tablet (25 mg total) by mouth every 6 (six) hours as needed for nausea. 04/29/12   Geoffery Lyons, MD   BP 156/93 mmHg  Pulse 82  Temp(Src) 98.4 F (36.9 C) (Oral)  Resp 16  Ht 5\' 5"  (1.651 m)  Wt 181 lb (82.101 kg)  BMI 30.12 kg/m2  SpO2 97% Physical Exam  Constitutional: She is oriented to person, place, and time. She appears well-developed and well-nourished.  HENT:  Head: Normocephalic and atraumatic.  Right Ear: External ear normal.  Left Ear: External ear normal.  Nose: Nose normal.  Mouth/Throat: Oropharynx is clear and moist.  The patient's lower lip has an approximately 5 mm vesicular lesion to the lip border. It does extend over the vermilion border and includes some of the dermis adjacent.  Eyes: Conjunctivae and EOM are normal. Pupils are equal, round, and reactive to light. Right eye exhibits no discharge. Left eye exhibits no discharge.  Neck: Normal range of motion. Neck supple.  Pulmonary/Chest: Effort normal.  Musculoskeletal: Normal range of motion.  Neurological: She is alert and oriented to person, place, and time. No cranial nerve deficit. Coordination normal.  Skin: Skin is warm and dry.  Psychiatric: She has a normal mood and affect.    ED Course  Procedures (including critical care time) Labs Review Labs Reviewed - No data to display  Imaging Review No results found.   EKG Interpretation None      MDM   Final diagnoses:  Recurrent herpes labialis   The patient resents is on above otherwise well with no signs at this time of secondary complications of herpes labialis. She has taken antivirals and the past and would like to take them again.    Arby Barrette, MD 05/18/14 [phone]

## 2014-05-18 NOTE — ED Notes (Signed)
Pt reports fever blister to right lower lip. Sts she has used valtrex in the past but doesn't have a PMD for the RX anymore.

## 2014-05-18 NOTE — Discharge Instructions (Signed)
Cold Sore  A cold sore (fever blister) is a skin infection caused by the herpes simplex virus (HSV-1). HSV-1 is closely related to the virus that causes genital herpes (HSV-2), but they are not the same even though both viruses can cause oral and genital infections. Cold sores are small, fluid-filled sores inside of the mouth or on the lips, gums, nose, chin, cheeks, or fingers.   The herpes simplex virus can be easily passed (contagious) to other people through close personal contact, such as kissing or sharing personal items. The virus can also spread to other parts of the body, such as the eyes or genitals. Cold sores are contagious until the sores crust over completely. They often heal within 2 weeks.   Once a person is infected, the herpes simplex virus remains permanently in the body. Therefore, there is no cure for cold sores, and they often recur when a person is tired, stressed, sick, or gets too much sun. Additional factors that can cause a recurrence include hormone changes in menstruation or pregnancy, certain drugs, and cold weather.   CAUSES   Cold sores are caused by the herpes simplex virus. The virus is spread from person to person through close contact, such as through kissing, touching the affected area, or sharing personal items such as lip balm, razors, or eating utensils.   SYMPTOMS   The first infection may not cause symptoms. If symptoms develop, the symptoms often go through different stages. Here is how a cold sore develops:   · Tingling, itching, or burning is felt 1-2 days before the outbreak.    · Fluid-filled blisters appear on the lips, inside the mouth, nose, or on the cheeks.    · The blisters start to ooze clear fluid.    · The blisters dry up and a yellow crust appears in its place.    · The crust falls off.    Symptoms depend on whether it is the initial outbreak or a recurrence. Some other symptoms with the first outbreak may include:   · Fever.    · Sore throat.    · Headache.     · Muscle aches.    · Swollen neck glands.    DIAGNOSIS   A diagnosis is often made based on your symptoms and looking at the sores. Sometimes, a sore may be swabbed and then examined in the lab to make a final diagnosis. If the sores are not present, blood tests can find the herpes simplex virus.   TREATMENT   There is no cure for cold sores and no vaccine for the herpes simplex virus. Within 2 weeks, most cold sores go away on their own without treatment. Medicines cannot make the infection go away, but medicine can help relieve some of the pain associated with the sores, can work to stop the virus from multiplying, and can also shorten healing time. Medicine may be in the form of creams, gels, pills, or a shot.   HOME CARE INSTRUCTIONS   · Only take over-the-counter or prescription medicines for pain, discomfort, or fever as directed by your caregiver. Do not use aspirin.    · Use a cotton-tip swab to apply creams or gels to your sores.    · Do not touch the sores or pick the scabs. Wash your hands often. Do not touch your eyes without washing your hands first.    · Avoid kissing, oral sex, and sharing personal items until sores heal.    · Apply an ice pack on your sores for 10-15 minutes to ease any discomfort.    ·   Avoid hot, cold, or salty foods because they may hurt your mouth. Eat a soft, bland diet to avoid irritating the sores. Use a straw to drink if you have pain when drinking out of a glass.    · Keep sores clean and dry to prevent an infection of other tissues.    · Avoid the sun and limit stress if these things trigger outbreaks. If sun causes cold sores, apply sunscreen on the lips before being out in the sun.    SEEK MEDICAL CARE IF:   · You have a fever or persistent symptoms for more than 2-3 days.    · You have a fever and your symptoms suddenly get worse.    · You have pus, not clear fluid, coming from the sores.    · You have redness that is spreading.    · You have pain or irritation in your  eye.    · You get sores on your genitals.    · Your sores do not heal within 2 weeks.    · You have a weakened immune system.    · You have frequent recurrences of cold sores.    MAKE SURE YOU:   · Understand these instructions.  · Will watch your condition.  · Will get help right away if you are not doing well or get worse.  Document Released: 03/01/2000 Document Revised: 07/19/2013 Document Reviewed: 07/17/2011  [employer]® Patient Information ©2015 [employer]. This information is not intended to replace advice given to you by your health care provider. Make sure you discuss any questions you have with your health care provider.

## 2014-05-18 NOTE — ED Notes (Signed)
MD at bedside. 

## 2014-09-14 ENCOUNTER — Emergency Department (HOSPITAL_BASED_OUTPATIENT_CLINIC_OR_DEPARTMENT_OTHER): Payer: Self-pay

## 2014-09-14 ENCOUNTER — Emergency Department (HOSPITAL_BASED_OUTPATIENT_CLINIC_OR_DEPARTMENT_OTHER)
Admission: EM | Admit: 2014-09-14 | Discharge: 2014-09-14 | Disposition: A | Discharge status: Home / Self Care | Payer: Self-pay | Attending: Emergency Medicine | Admitting: Emergency Medicine

## 2014-09-14 ENCOUNTER — Encounter (HOSPITAL_BASED_OUTPATIENT_CLINIC_OR_DEPARTMENT_OTHER): Payer: Self-pay | Admitting: Emergency Medicine

## 2014-09-14 DIAGNOSIS — M25472 Effusion, left ankle: Secondary | ICD-10-CM | POA: Insufficient documentation

## 2014-09-14 DIAGNOSIS — Z791 Long term (current) use of non-steroidal anti-inflammatories (NSAID): Secondary | ICD-10-CM | POA: Insufficient documentation

## 2014-09-14 DIAGNOSIS — Z79899 Other long term (current) drug therapy: Secondary | ICD-10-CM | POA: Insufficient documentation

## 2014-09-14 DIAGNOSIS — E119 Type 2 diabetes mellitus without complications: Secondary | ICD-10-CM | POA: Insufficient documentation

## 2014-09-14 DIAGNOSIS — I10 Essential (primary) hypertension: Secondary | ICD-10-CM | POA: Insufficient documentation

## 2014-09-14 DIAGNOSIS — M7989 Other specified soft tissue disorders: Secondary | ICD-10-CM

## 2014-09-14 DIAGNOSIS — Z720 Tobacco use: Secondary | ICD-10-CM | POA: Insufficient documentation

## 2014-09-14 LAB — CBC WITH DIFFERENTIAL/PLATELET
Basophils Absolute: 0 10*3/uL (ref 0.0–0.1)
Basophils Relative: 0 % (ref 0–1)
Eosinophils Absolute: 0.1 10*3/uL (ref 0.0–0.7)
Eosinophils Relative: 2 % (ref 0–5)
HCT: 38.7 % (ref 36.0–46.0)
HEMOGLOBIN: 13.1 g/dL (ref 12.0–15.0)
LYMPHS ABS: 1.9 10*3/uL (ref 0.7–4.0)
LYMPHS PCT: 30 % (ref 12–46)
MCH: 27.5 pg (ref 26.0–34.0)
MCHC: 33.9 g/dL (ref 30.0–36.0)
MCV: 81.3 fL (ref 78.0–100.0)
MONOS PCT: 5 % (ref 3–12)
Monocytes Absolute: 0.3 10*3/uL (ref 0.1–1.0)
NEUTROS ABS: 3.9 10*3/uL (ref 1.7–7.7)
NEUTROS PCT: 63 % (ref 43–77)
Platelets: 283 10*3/uL (ref 150–400)
RBC: 4.76 MIL/uL (ref 3.87–5.11)
RDW: 13.4 % (ref 11.5–15.5)
WBC: 6.3 10*3/uL (ref 4.0–10.5)

## 2014-09-14 LAB — BASIC METABOLIC PANEL
Anion gap: 7 (ref 5–15)
BUN: 10 mg/dL (ref 6–20)
CALCIUM: 8.9 mg/dL (ref 8.9–10.3)
CHLORIDE: 107 mmol/L (ref 101–111)
CO2: 26 mmol/L (ref 22–32)
CREATININE: 0.61 mg/dL (ref 0.44–1.00)
GLUCOSE: 104 mg/dL — AB (ref 65–99)
POTASSIUM: 3.9 mmol/L (ref 3.5–5.1)
SODIUM: 140 mmol/L (ref 135–145)

## 2014-09-14 NOTE — ED Provider Notes (Signed)
CSN: [id number]     Arrival date & time 09/14/14  1611 History   First MD Initiated Contact with Patient 09/14/14 1619     Chief Complaint  Patient presents with  . Joint Swelling     (Consider location/radiation/quality/duration/timing/severity/associated sxs/prior Treatment) HPI Comments: Pt comes in with c/o left lower extremity swelling for 1 day. Denies pain. She states that she often has leg swelling on this side with her blood pressure being up but her bp is not elevated. Denies cp or sob. She has been taking her bp medication. No recent travel.  The history is provided by the patient. No language interpreter was used.    Past Medical History  Diagnosis Date  . Hypertension   . Diabetes mellitus    History reviewed. No pertinent past surgical history. No family history on file. History  Substance Use Topics  . Smoking status: Current Every Day Smoker -- 0.50 packs/day    Types: Cigarettes  . Smokeless tobacco: Never Used  . Alcohol Use: No   OB History    No data available     Review of Systems  All other systems reviewed and are negative.     Allergies  Review of patient's allergies indicates no known allergies.  Home Medications   Prior to Admission medications   Medication Sig Start Date End Date Taking? Authorizing Provider  metoprolol succinate (TOPROL-XL) 25 MG 24 hr tablet Take 25 mg by mouth 2 (two) times daily.   Yes Historical Provider, MD  acetaminophen (TYLENOL) 325 MG tablet Take 650 mg by mouth every 6 (six) hours as needed. Patient used this medication for the pain in her chest.    Historical Provider, MD  acyclovir (ZOVIRAX) 400 MG tablet Take 1 tablet (400 mg total) by mouth 5 (five) times daily. 05/18/14   Arby Barrette, MD  calcium carbonate (OS-CAL) 1250 MG chewable tablet Chew 1 tablet by mouth daily. Patient has been using this medication for heartburn.    Historical Provider, MD  lisinopril (PRINIVIL,ZESTRIL) 10 MG tablet Take 1 tablet  (10 mg total) by mouth daily. 11/24/12   Susy Frizzle, MD  metFORMIN (GLUCOPHAGE) 500 MG tablet Take by mouth 2 (two) times daily with a meal.    Historical Provider, MD  naproxen (NAPROSYN) 500 MG tablet Take 1 tablet (500 mg total) by mouth 2 (two) times daily. 11/24/12   Susy Frizzle, MD  promethazine (PHENERGAN) 25 MG tablet Take 1 tablet (25 mg total) by mouth every 6 (six) hours as needed for nausea. 04/29/12   Geoffery Lyons, MD   BP 132/83 mmHg  Pulse 91  Temp(Src) 98.7 F (37.1 C) (Oral)  Resp 16  Ht 5\' 4"  (1.626 m)  Wt 170 lb (77.111 kg)  BMI 29.17 kg/m2  SpO2 100% Physical Exam  Constitutional: She is oriented to person, place, and time. She appears well-developed and well-nourished.  Cardiovascular: Normal rate and regular rhythm.   Pulmonary/Chest: Effort normal and breath sounds normal.  Musculoskeletal: Normal range of motion.  Mild swelling noted to the left lower extremity with some with primary swelling noted in the ankle. Pulses intact. No redness or warmth noted  Neurological: She is alert and oriented to person, place, and time.  Skin: Skin is warm and dry.  Psychiatric: She has a normal mood and affect.  Nursing note and vitals reviewed.   ED Course  Procedures (including critical care time) Labs Review Labs Reviewed  BASIC METABOLIC PANEL - Abnormal; Notable for the following:  Glucose, Bld 104 (*)    All other components within normal limits  CBC WITH DIFFERENTIAL/PLATELET    Imaging Review No results found.   EKG Interpretation None      MDM   Final diagnoses:  Swelling of lower extremity    Pt will not agree to anything besides basic labs and [place]. She states that she is not going to have any other testing. She is willing to come for an [place] tomorrow but is not willing to have prophylactic treatment    Teressa Lower, NP 09/14/14 1720  Blake Divine, MD 09/14/14 [phone]

## 2014-09-14 NOTE — Discharge Instructions (Signed)
You need to return tomorrow at 12:30 for the ultrasound Peripheral Edema You have swelling in your legs (peripheral edema). This swelling is due to excess accumulation of salt and water in your body. Edema may be a sign of heart, kidney or liver disease, or a side effect of a medication. It may also be due to problems in the leg veins. Elevating your legs and using special support stockings may be very helpful, if the cause of the swelling is due to poor venous circulation. Avoid long periods of standing, whatever the cause. Treatment of edema depends on identifying the cause. Chips, pretzels, pickles and other salty foods should be avoided. Restricting salt in your diet is almost always needed. Water pills (diuretics) are often used to remove the excess salt and water from your body via urine. These medicines prevent the kidney from reabsorbing sodium. This increases urine flow. Diuretic treatment may also result in lowering of potassium levels in your body. Potassium supplements may be needed if you have to use diuretics daily. Daily weights can help you keep track of your progress in clearing your edema. You should call your caregiver for follow up care as recommended. SEEK IMMEDIATE MEDICAL CARE IF:   You have increased swelling, pain, redness, or heat in your legs.  You develop shortness of breath, especially when lying down.  You develop chest or abdominal pain, weakness, or fainting.  You have a fever. Document Released: 04/11/2004 Document Revised: 05/27/2011 Document Reviewed: 03/22/2009 [facility] Patient Information 2015 [place], [place]. This information is not intended to replace advice given to you by your health care provider. Make sure you discuss any questions you have with your health care provider.

## 2014-09-14 NOTE — ED Notes (Addendum)
Swelling of left lower leg since yesterday with no known injury.  Pt sts that she gets a little swelling in her ankles at times but not this bad. No long distance trips.

## 2014-09-15 ENCOUNTER — Other Ambulatory Visit (HOSPITAL_BASED_OUTPATIENT_CLINIC_OR_DEPARTMENT_OTHER): Payer: Self-pay

## 2014-09-15 ENCOUNTER — Other Ambulatory Visit (HOSPITAL_BASED_OUTPATIENT_CLINIC_OR_DEPARTMENT_OTHER): Payer: Self-pay | Admitting: Nurse Practitioner

## 2014-09-15 DIAGNOSIS — M2540 Effusion, unspecified joint: Secondary | ICD-10-CM

## 2014-12-01 ENCOUNTER — Emergency Department (HOSPITAL_BASED_OUTPATIENT_CLINIC_OR_DEPARTMENT_OTHER)
Admission: EM | Admit: 2014-12-01 | Discharge: 2014-12-01 | Disposition: A | Discharge status: Home / Self Care | Payer: Self-pay | Attending: Emergency Medicine | Admitting: Emergency Medicine

## 2014-12-01 ENCOUNTER — Encounter (HOSPITAL_BASED_OUTPATIENT_CLINIC_OR_DEPARTMENT_OTHER): Payer: Self-pay | Admitting: *Deleted

## 2014-12-01 ENCOUNTER — Emergency Department (HOSPITAL_BASED_OUTPATIENT_CLINIC_OR_DEPARTMENT_OTHER): Payer: Self-pay

## 2014-12-01 DIAGNOSIS — Z79899 Other long term (current) drug therapy: Secondary | ICD-10-CM | POA: Insufficient documentation

## 2014-12-01 DIAGNOSIS — I10 Essential (primary) hypertension: Secondary | ICD-10-CM | POA: Insufficient documentation

## 2014-12-01 DIAGNOSIS — M79671 Pain in right foot: Secondary | ICD-10-CM | POA: Insufficient documentation

## 2014-12-01 DIAGNOSIS — Z720 Tobacco use: Secondary | ICD-10-CM | POA: Insufficient documentation

## 2014-12-01 DIAGNOSIS — Z791 Long term (current) use of non-steroidal anti-inflammatories (NSAID): Secondary | ICD-10-CM | POA: Insufficient documentation

## 2014-12-01 DIAGNOSIS — E119 Type 2 diabetes mellitus without complications: Secondary | ICD-10-CM | POA: Insufficient documentation

## 2014-12-01 MED ORDER — TRAMADOL HCL 50 MG PO TABS: 50.0000 mg | ORAL_TABLET | Freq: Two times a day (BID) | ORAL | Status: DC | PRN

## 2014-12-01 MED ORDER — TRAMADOL HCL 50 MG PO TABS
50.0000 mg | ORAL_TABLET | Freq: Once | ORAL | Status: AC
  Administered 2014-12-01: 50 mg via ORAL
  Filled 2014-12-01: qty 1

## 2014-12-01 NOTE — ED Notes (Signed)
Pt walking the hallways, talking loudly on her cell phone "I don't know what I'm waiting for, I just need to get out of here!" pt escorted back to her room, asked to remain in her room to await d/c paperwork. Pt angry, states "I don't know why I have to keep waiting here, he said I can go when I get my papers!" explained to pt that dr. Chaney Malling is writing up her paperwork and that I can d/c her as soon as dr. Chaney Malling finishes her paperwork.

## 2014-12-01 NOTE — ED Notes (Signed)
MD at bedside. 

## 2014-12-01 NOTE — ED Provider Notes (Signed)
CSN: [id number]     Arrival date & time 12/01/14  0815 History   First MD Initiated Contact with Patient 12/01/14 [phone]     Chief Complaint  Patient presents with  . Foot Pain     (Consider location/radiation/quality/duration/timing/severity/associated sxs/prior Treatment) Patient is a 44 y.o. female presenting with lower extremity pain.  Foot Pain This is a chronic problem. The current episode started more than 1 week ago. The problem occurs constantly. Progression since onset: intermittent. Pertinent negatives include no chest pain. Nothing aggravates the symptoms. Nothing relieves the symptoms. She has tried nothing for the symptoms.    Past Medical History  Diagnosis Date  . Hypertension   . Diabetes mellitus    History reviewed. No pertinent past surgical history. History reviewed. No pertinent family history. Social History  Substance Use Topics  . Smoking status: Current Every Day Smoker -- 0.50 packs/day    Types: Cigarettes  . Smokeless tobacco: Never Used  . Alcohol Use: No   OB History    No data available     Review of Systems  Cardiovascular: Negative for chest pain.  Genitourinary: Negative for dysuria, urgency and hematuria.  Musculoskeletal:       Right and left foot pain  Neurological: Negative for seizures and syncope.  Psychiatric/Behavioral: Negative for confusion.  All other systems reviewed and are negative.     Allergies  Review of patient's allergies indicates no known allergies.  Home Medications   Prior to Admission medications   Medication Sig Start Date End Date Taking? Authorizing Provider  acetaminophen (TYLENOL) 325 MG tablet Take 650 mg by mouth every 6 (six) hours as needed. Patient used this medication for the pain in her chest.    Historical Provider, MD  lisinopril (PRINIVIL,ZESTRIL) 10 MG tablet Take 1 tablet (10 mg total) by mouth daily. 11/24/12   Susy Frizzle, MD  metFORMIN (GLUCOPHAGE) 500 MG tablet Take by mouth 2 (two)  times daily with a meal.    Historical Provider, MD  metoprolol succinate (TOPROL-XL) 25 MG 24 hr tablet Take 25 mg by mouth 2 (two) times daily.    Historical Provider, MD  naproxen (NAPROSYN) 500 MG tablet Take 1 tablet (500 mg total) by mouth 2 (two) times daily. 11/24/12   Susy Frizzle, MD  traMADol (ULTRAM) 50 MG tablet Take 1 tablet (50 mg total) by mouth every 12 (twelve) hours as needed for severe pain. 12/01/14   Marily Memos, MD   BP 122/69 mmHg  Pulse 78  Temp(Src) 98 F (36.7 C) (Oral)  Resp 18  Ht 5\' 3"  (1.6 m)  Wt 170 lb (77.111 kg)  BMI 30.12 kg/m2  SpO2 99% Physical Exam  Constitutional: She is oriented to person, place, and time. She appears well-developed and well-nourished.  HENT:  Head: Normocephalic and atraumatic.  Eyes: Conjunctivae and EOM are normal. Right eye exhibits no discharge. Left eye exhibits no discharge.  Cardiovascular: Normal rate and regular rhythm.   Pulmonary/Chest: Effort normal and breath sounds normal. No respiratory distress.  Abdominal: Soft. She exhibits no distension. There is no tenderness. There is no rebound.  Musculoskeletal: Normal range of motion. She exhibits no edema or tenderness.  Right foot with a solid tender 1.5 cm swelling. Immobile. Left foot with 1.5 cm soft nontender swelling to plantar surface just proximal to her MTP's.  Neurological: She is alert and oriented to person, place, and time.  Skin: Skin is warm and dry.  Nursing note and vitals reviewed.   ED  Course  Procedures (including critical care time) Labs Review Labs Reviewed - No data to display  Imaging Review Dg Foot Complete Right  12/01/2014   CLINICAL DATA:  Area of palpable concern within the anterior foot.  EXAM: RIGHT FOOT COMPLETE - 3+ VIEW  COMPARISON:  04/21/2011  FINDINGS: No evidence of acute fracture or subluxation. No focal bone lesion or bone destruction. 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 Bone cortex and  trabecular architecture appear intact. No radiopaque soft tissue foreign bodies. There is a 2.7 cm area of soft tissue swelling within the mid dorsum of the foot.  IMPRESSION: Focal mid dorsal soft tissue swelling, without underlying bony abnormality. Further evaluation with MRI may be considered, if soft tissue mass is clinically suspected.   Electronically Signed   By: Ted Mcalpine M.D.   On: 12/01/2014 09:38   I have personally reviewed and evaluated these images and lab results as part of my medical decision-making.   EKG Interpretation None      MDM   Final diagnoses:  Right foot pain   Right foot with hard swelling on dorsum, ttp, doubt abscess. Will xr to eval for healing fracture.  Left foot with soft swelling to plantar surface. Been there for years, worse with walking recently. Been tod it was a ganglion cyst. Will tx conservatively.  Will have FU w/ podiatry.   xr with evidence of soft tissue mass in right foot. Unsure of cause. Will follow-up with doctor or podiatrist for further imaging and evaluation.  I have personally and contemperaneously reviewed labs and imaging and used in my decision making as above.   A medical screening exam was performed and I feel the patient has had an appropriate workup for their chief complaint at this time and likelihood of emergent condition existing is low. They have been counseled on decision, discharge, follow up and which symptoms necessitate immediate return to the emergency department. They or their family verbally stated understanding and agreement with plan and discharged in stable condition.      Marily Memos, MD 12/01/14 [phone]

## 2014-12-01 NOTE — Discharge Instructions (Signed)
You have soft tissue swelling of your right foot. I am unsure of the cause of this. He need to have further evaluation done by a podiatrist or your primary doctor. If he do not have a primary doctor he can refer to list below to set up an appointment. You may need more imaging of this.    Emergency Department Resource Guide 1) Find a Doctor and Pay Out of Pocket Although you won't have to find out who is covered by your insurance plan, it is a good idea to ask around and get recommendations. You will then need to call the office and see if the doctor you have chosen will accept you as a new patient and what types of options they offer for patients who are self-pay. Some doctors offer discounts or will set up payment plans for their patients who do not have insurance, but you will need to ask so you aren't surprised when you get to your appointment.  2) Contact Your Local Health Department Not all health departments have doctors that can see patients for sick visits, but many do, so it is worth a call to see if yours does. If you don't know where your local health department is, you can check in your phone book. The CDC also has a tool to help you locate your state's health department, and many state websites also have listings of all of their local health departments.  3) Find a Walk-in Clinic If your illness is not likely to be very severe or complicated, you may want to try a walk in clinic. These are popping up all over the country in pharmacies, drugstores, and shopping centers. They're usually staffed by nurse practitioners or physician assistants that have been trained to treat common illnesses and complaints. They're usually fairly quick and inexpensive. However, if you have serious medical issues or chronic medical problems, these are probably not your best option.  No Primary Care Doctor: - Call Health Connect at  [phone] - they can help you locate a primary care doctor that  accepts your  insurance, provides certain services, etc. - Physician Referral Service- [phone]  Chronic Pain Problems: Organization         Address  Phone   Notes  [facility]  [phone] Patients need to be referred by their primary care doctor.   Medication Assistance: Organization         Address  Phone   Notes  [facility] Medication [facility] [address] [place] [place], [place] [address] [phone] --Must be a resident of [facility] -- Must have NO insurance coverage whatsoever (no Medicaid/ Medicare, etc.) -- The pt. MUST have a primary care doctor that directs their care regularly and follows them in the community   [facility]  [phone]   [employer]  [phone]    Agencies that provide inexpensive medical care: Organization         Address  Phone   Notes  [facility]  [phone]   [facility]    [phone]   [facility] [address] [place], [place] [address] [phone]   [facility] [address] [place] [place] [phone]   Planned Parenthood    [phone]   [facility]    [phone]   [facility]  [address], [place] Phone:  [phone], Fax:  [phone] Hours of Operation:  9 am - 6 pm, M-F.  Also accepts Medicaid/Medicare and self-pay.  [facility]  [address], [place] Phone: [phone], Fax: [phone]. Hours of Operation:  8:30 am - 5:30 pm, M-F.  Also accepts Medicaid and self-pay.  [facility] [place] [address] [place] Phone: [phone]   [facility] [address] Natasha Bence [place], [place] [phone], Ext. [phone] Mondays & Thursdays: 7-9 AM.  First 15 patients are seen on a first come, first serve basis.    Medicaid-accepting [facility] Providers:  Organization          Address  Phone   Notes  [facility] [address], [place] [phone] Also accepts self-pay patients.  [facility] [address] Laurell Josephs [place], [place]  [phone]   [facility] [address], [place] [phone]   [facility] [address] [place] [phone]   Renaye Rakers [address], [place]   [phone] Only accepts [place] Access [place] patients after they have their name applied to their card.   Self-Pay (no insurance) in [facility]:  Organization         Address  Phone   Notes  Sickle Cell Patients, [facility] [address] [place], [place] [phone]   [facility] [address] [place], [place] [phone]   [facility] [place]  [address], [place] [phone]   [facility]/Dr. Osei-Bonsu  [address] [place] or [address], [place] [phone] Phone number for both [place] and [place] locations is the same.  [facility]gent [facility] [address] [place] [phone]   [facility] [address], [place] or [address] [phone]   [facility] [address], [place] [phone], phone; [phone], fax Sees patients 1st and 3rd Saturday of every month.  Must not qualify for public or private insurance (i.e. Medicaid, Medicare, [place] Health Choice, Veterans' Benefits)  Household income should be no more than 200% of the poverty level The clinic cannot treat you if you are pregnant or think you are pregnant  Sexually transmitted diseases are not treated at the clinic.    Dental Care: Organization         Address  Phone  Notes  [facility] Department of [facility] [address] [place],  [place] [phone] Accepts children up to age 17 who are enrolled in [place] or [place] Health Choice; pregnant women with a Medicaid card; and children who have applied for Medicaid or [place] Health Choice, but were declined, whose parents can pay a reduced fee at time of service.  [facility] Department of [facility]  [address], [place] [phone] Accepts children up to age 42 who are enrolled in [place] or [place] Health Choice; pregnant women with a Medicaid card; and children who have applied for Medicaid or [place] Health Choice, but were declined, whose parents can pay a reduced fee at time of service.  [facility]  [address] [place], [place] [phone] Patients are seen by appointment only. Walk-ins are not accepted. [facility] will see patients 54 years of age and older. Monday - Tuesday (8am-5pm) Most Wednesdays (8:30-5pm) $30 per visit, cash only  [facility] Adult Dental Access PROGRAM  [address], [facility] [phone] Patients are seen by appointment only. Walk-ins are not accepted. [place] will see patients 55 years of age and older. One Wednesday Evening (Monthly: Volunteer Based).  $30 per visit, cash only  [place]  [phone] for adults; Children under age 20, call Graduate Pediatric Dentistry at [phone]. Children aged 41-14, please call [phone] to request a pediatric application.  Dental services are provided in all areas of dental care including fillings, crowns and bridges, complete and partial dentures, implants, gum treatment, root canals, and extractions. Preventive care is also provided. Treatment is provided to both adults and children. Patients are selected via a lottery and there is often a waiting list.   [facility] [place] [address], [place]  [phone] [email or web address]   [facility] [address] [place], [place]  [phone], Ext. [phone] Second and Fourth Thursday of each month, opens at 6:30 AM; Clinic ends at 9 AM.  Patients are seen on a first-come first-served basis, and a limited number are seen during each clinic.   [facility]  [address] Hillard Danker [place], [place] [phone]   Eligibility Requirements You must have lived in [place], [place], or [place] counties for at least the last three months.   You cannot be eligible for state or federal sponsored [employer], including [employer], [place], or [employer].   You generally cannot be eligible for healthcare insurance through your employer.    How to apply: Eligibility screenings are held every Tuesday and Wednesday afternoon from 1:00 pm until 4:00 pm. You do not need an appointment for the interview!  [facility] [address] [place], [place]   [place]  [place] Department  [place]  [phone]    Behavioral Health Resources in the Community: Intensive Outpatient Programs Organization         Address  Phone  Notes  [place]. [address] [place], [place] [phone]   [facility] Outpatient [address] [place], [place]   ADS: [facility] [address] [place], [place]   [place] [address]  [place], [place] or [phone]   Substance Abuse Resources Organization         Address  Phone  Notes  [facility] Drug Services  [phone]   [place]  [phone]   The [place]   Chinita Pester  [phone]   Residential & Outpatient Substance Abuse Program  [phone]   Psychological Services Organization         Address  Phone  Notes  [facility] [place]  [place]  [phone]    [place] [address], [place] or [phone]    Mobile Crisis Teams Organization         Address  Phone  Notes  [facility], Mobile Crisis Care Unit  [phone]   [facility]  [address] [place], [place]   [facility] [address] [place] [phone]    Self-Help/Support Groups Organization  Address  Phone             Notes  [place]. of [place] - variety of support groups  [place] Call for more information  Narcotics Anonymous (NA), [facility] [address], [employer] [place]  2 meetings at this location   Special educational needs teacher         Address  Phone  Notes  [facility] [place],    [place]  [phone]   [facility]  [address] [place] [address], [place], [place]   [place], [place] [phone] Admissions: 8am-3pm M-F  Incentives Substance [place] [address],    [place], [place] [phone]   [facility] [address] [place], [place], [place]   [facility] [address]  [place], [place]   [facility] - Intensive Outpatient [address] Kristeen Mans [address], [place], [place]   [facility] ([place].,  [place], [place] [phone] or [phone]   [facility] ([facility]) [address] [place], [place] Accepts Medicaid  [facility] [place] [address]  [place] [phone] Substance Abuse/Addiction Treatment   [facility] Organization         Address  Phone  Notes  [facility]  [phone]   Domenic Schwab, PhD [address] Arlis Porta [place], [place]   [phone] or [phone]   [place]  [place], [place] [phone]   [facility] [address] [place], [place] [phone] Insurance/Medicaid/sponsorship through [facility] and Families [address] [place]                                    [place], [place] [phone] [place] [address], [place] [phone]    Dr. Adele Schilder  [phone]   [facility] Dept. 1) [address], [place] 2) [place] 3)  [place] [address], [place] [phone]  [phone]   [place] [phone] or [phone] (After Hours)

## 2014-12-01 NOTE — ED Notes (Signed)
Pt amb to room 8 with quick steady gait in nad. Pt reports months of pain to both feet, increasing over the last few days, no injury or trauma, but states she does work on her feet all day. Swelling noted to top of right, and near arch of left foot.

## 2015-05-29 ENCOUNTER — Encounter (HOSPITAL_BASED_OUTPATIENT_CLINIC_OR_DEPARTMENT_OTHER): Payer: Self-pay | Admitting: *Deleted

## 2015-05-29 ENCOUNTER — Emergency Department (HOSPITAL_BASED_OUTPATIENT_CLINIC_OR_DEPARTMENT_OTHER)
Admission: EM | Admit: 2015-05-29 | Discharge: 2015-05-29 | Disposition: A | Discharge status: Home / Self Care | Payer: Self-pay | Attending: Emergency Medicine | Admitting: Emergency Medicine

## 2015-05-29 DIAGNOSIS — E119 Type 2 diabetes mellitus without complications: Secondary | ICD-10-CM | POA: Insufficient documentation

## 2015-05-29 DIAGNOSIS — F17210 Nicotine dependence, cigarettes, uncomplicated: Secondary | ICD-10-CM | POA: Insufficient documentation

## 2015-05-29 DIAGNOSIS — I10 Essential (primary) hypertension: Secondary | ICD-10-CM | POA: Insufficient documentation

## 2015-05-29 DIAGNOSIS — M545 Low back pain: Secondary | ICD-10-CM | POA: Insufficient documentation

## 2015-05-29 NOTE — ED Notes (Signed)
Bilateral lower back pain, worsening with movement x 2 days.  Ambulatory.  Denies urinary symptoms.

## 2015-05-29 NOTE — ED Notes (Signed)
Pt to registration desk. States she is leaving but will return in the morning. Advised she can return at any time. Ambulatory with steady gait

## 2015-05-29 NOTE — ED Notes (Signed)
Pt left prior to charge nurse being notified

## 2015-11-07 ENCOUNTER — Other Ambulatory Visit: Payer: Self-pay | Admitting: Obstetrics and Gynecology

## 2015-11-07 DIAGNOSIS — Z1231 Encounter for screening mammogram for malignant neoplasm of breast: Secondary | ICD-10-CM

## 2015-11-15 ENCOUNTER — Telehealth (HOSPITAL_COMMUNITY): Payer: Self-pay | Admitting: *Deleted

## 2015-11-15 NOTE — Telephone Encounter (Signed)
Telephoned patient at home number and left message about [facility] appointment for Thursday August 31 2:00

## 2015-11-16 ENCOUNTER — Ambulatory Visit (HOSPITAL_COMMUNITY): Payer: Self-pay

## 2016-01-04 ENCOUNTER — Emergency Department (HOSPITAL_BASED_OUTPATIENT_CLINIC_OR_DEPARTMENT_OTHER): Payer: Medicaid Other

## 2016-01-04 ENCOUNTER — Emergency Department (HOSPITAL_BASED_OUTPATIENT_CLINIC_OR_DEPARTMENT_OTHER)
Admission: EM | Admit: 2016-01-04 | Discharge: 2016-01-04 | Disposition: A | Discharge status: Home / Self Care | Payer: Medicaid Other | Attending: Emergency Medicine | Admitting: Emergency Medicine

## 2016-01-04 ENCOUNTER — Encounter (HOSPITAL_BASED_OUTPATIENT_CLINIC_OR_DEPARTMENT_OTHER): Payer: Self-pay | Admitting: *Deleted

## 2016-01-04 DIAGNOSIS — Z7984 Long term (current) use of oral hypoglycemic drugs: Secondary | ICD-10-CM | POA: Insufficient documentation

## 2016-01-04 DIAGNOSIS — Z79899 Other long term (current) drug therapy: Secondary | ICD-10-CM | POA: Insufficient documentation

## 2016-01-04 DIAGNOSIS — E119 Type 2 diabetes mellitus without complications: Secondary | ICD-10-CM | POA: Diagnosis not present

## 2016-01-04 DIAGNOSIS — Z79891 Long term (current) use of opiate analgesic: Secondary | ICD-10-CM | POA: Insufficient documentation

## 2016-01-04 DIAGNOSIS — F17210 Nicotine dependence, cigarettes, uncomplicated: Secondary | ICD-10-CM | POA: Diagnosis not present

## 2016-01-04 DIAGNOSIS — I10 Essential (primary) hypertension: Secondary | ICD-10-CM | POA: Insufficient documentation

## 2016-01-04 DIAGNOSIS — Y939 Activity, unspecified: Secondary | ICD-10-CM | POA: Diagnosis not present

## 2016-01-04 DIAGNOSIS — Y999 Unspecified external cause status: Secondary | ICD-10-CM | POA: Insufficient documentation

## 2016-01-04 DIAGNOSIS — S20211A Contusion of right front wall of thorax, initial encounter: Secondary | ICD-10-CM | POA: Diagnosis not present

## 2016-01-04 DIAGNOSIS — W109XXA Fall (on) (from) unspecified stairs and steps, initial encounter: Secondary | ICD-10-CM | POA: Insufficient documentation

## 2016-01-04 DIAGNOSIS — S298XXA Other specified injuries of thorax, initial encounter: Secondary | ICD-10-CM

## 2016-01-04 DIAGNOSIS — Y929 Unspecified place or not applicable: Secondary | ICD-10-CM | POA: Diagnosis not present

## 2016-01-04 DIAGNOSIS — S92352A Displaced fracture of fifth metatarsal bone, left foot, initial encounter for closed fracture: Secondary | ICD-10-CM | POA: Diagnosis not present

## 2016-01-04 DIAGNOSIS — S92355A Nondisplaced fracture of fifth metatarsal bone, left foot, initial encounter for closed fracture: Secondary | ICD-10-CM

## 2016-01-04 DIAGNOSIS — S99922A Unspecified injury of left foot, initial encounter: Secondary | ICD-10-CM | POA: Diagnosis present

## 2016-01-04 DIAGNOSIS — W19XXXA Unspecified fall, initial encounter: Secondary | ICD-10-CM

## 2016-01-04 MED ORDER — NAPROXEN 250 MG PO TABS: 250.0000 mg | ORAL_TABLET | Freq: Two times a day (BID) | ORAL | 0 refills | Status: DC

## 2016-01-04 MED ORDER — HYDROCODONE-ACETAMINOPHEN 5-325 MG PO TABS
1.0000 | ORAL_TABLET | Freq: Once | ORAL | Status: AC
  Administered 2016-01-04: 1 via ORAL
  Filled 2016-01-04: qty 1

## 2016-01-04 MED FILL — NAPROXEN 250 MG TABLET: 250 | 15 days supply | Qty: 30 | Fill #0

## 2016-01-04 NOTE — ED Provider Notes (Signed)
[facility] Provider Note   CSN: [id number] Arrival date & time: 01/04/16  1053     History   Chief Complaint Chief Complaint  Patient presents with  . Fall    HPI Jeanette Potts is a 45 y.o. female.  Jeanette Potts is a 45 y.o. Female who presents to the emergency department after a fall 3 days ago. Patient reports he went up into her attic to reset her air conditioning unit and when coming back down she slipped on the steps and fell on her right side. She denies hitting her head or LOC. She reports right lateral neck pain, right sided rib pain, right hip pain, and left foot pain. She reports taking some Tylenol for his symptoms with little relief. No treatments prior to arrival. Patient denies fevers, head injury, loss of consciousness, numbness, tingling, weakness, shortness of breath, chest pain, abdominal pain, nausea, vomiting, diarrhea, rashes, or trouble ambulating.   The history is provided by the patient. No language interpreter was used.  Fall  Pertinent negatives include no chest pain, no abdominal pain, no headaches and no shortness of breath.    Past Medical History:  Diagnosis Date  . Diabetes mellitus   . Hypertension     There are no active problems to display for this patient.   History reviewed. No pertinent surgical history.  OB History    No data available       Home Medications    Prior to Admission medications   Medication Sig Start Date End Date Taking? Authorizing Provider  acetaminophen (TYLENOL) 325 MG tablet Take 650 mg by mouth every 6 (six) hours as needed. Patient used this medication for the pain in her chest.   Yes Historical Provider, MD  CLONAZEPAM PO Take by mouth.   Yes Historical Provider, MD  hydrochlorothiazide (MICROZIDE) 12.5 MG capsule Take 12.5 mg by mouth daily.   Yes Historical Provider, MD  metFORMIN (GLUCOPHAGE) 500 MG tablet Take by mouth 2 (two) times daily with a meal.   Yes Historical Provider, MD    metoprolol succinate (TOPROL-XL) 25 MG 24 hr tablet Take 25 mg by mouth 2 (two) times daily.   Yes Historical Provider, MD  Sertraline HCl (ZOLOFT PO) Take by mouth.   Yes Historical Provider, MD  lisinopril (PRINIVIL,ZESTRIL) 10 MG tablet Take 1 tablet (10 mg total) by mouth daily. 11/24/12   Susy Frizzle, MD  naproxen (NAPROSYN) 250 MG tablet Take 1 tablet (250 mg total) by mouth 2 (two) times daily with a meal. 01/04/16   Everlene Farrier, PA-C    Family History No family history on file.  Social History Social History  Substance Use Topics  . Smoking status: Current Every Day Smoker    Packs/day: 0.50    Types: Cigarettes  . Smokeless tobacco: Never Used  . Alcohol use No     Allergies   Review of patient's allergies indicates no known allergies.   Review of Systems Review of Systems  Constitutional: Negative for fever.  HENT: Negative for congestion, nosebleeds and sore throat.   Eyes: Negative for visual disturbance.  Respiratory: Negative for cough and shortness of breath.   Cardiovascular: Negative for chest pain.  Gastrointestinal: Negative for abdominal pain, nausea and vomiting.  Genitourinary: Negative for dysuria.  Musculoskeletal: Positive for arthralgias. Negative for back pain, neck pain and neck stiffness.  Skin: Negative for rash.  Neurological: Negative for syncope, facial asymmetry, weakness, light-headedness, numbness and headaches.     Physical Exam Updated  Vital Signs BP 122/87   Pulse 69   Temp 97.9 F (36.6 C) (Oral)   Resp 18   Ht 5\' 5"  (1.651 m)   Wt 86.2 kg   SpO2 98%   BMI 31.62 kg/m   Physical Exam  Constitutional: She is oriented to person, place, and time. She appears well-developed and well-nourished. No distress.  Nontoxic appearing.  HENT:  Head: Normocephalic and atraumatic.  Right Ear: External ear normal.  Left Ear: External ear normal.  Mouth/Throat: Oropharynx is clear and moist.  No visible signs of head trauma.   Eyes: Conjunctivae and EOM are normal. Pupils are equal, round, and reactive to light. Right eye exhibits no discharge. Left eye exhibits no discharge.  Neck: Normal range of motion. Neck supple. No JVD present. No tracheal deviation present.  No midline neck tenderness. Mild tenderness over her right trapezius musculature.  Cardiovascular: Normal rate, regular rhythm, normal heart sounds and intact distal pulses.   Bilateral radial, posterior tibialis and dorsalis pedis pulses are intact.    Pulmonary/Chest: Effort normal and breath sounds normal. No stridor. No respiratory distress. She has no wheezes. She has no rales. She exhibits tenderness.  Lungs are clear to auscultation bilaterally. Symmetric chest expansion bilaterally. Patient has right-sided chest wall tenderness to palpation. No crepitus. No deformity.  Abdominal: Soft. Bowel sounds are normal. There is no tenderness. There is no guarding.  Abdomen is soft and nontender to palpation.  Musculoskeletal: Normal range of motion. She exhibits tenderness. She exhibits no edema or deformity.  No midline neck or back tenderness. Mild tenderness to her right lateral hip without overlying skin changes. No pain with range of motion at her hip. Mild tenderness to the dorsum of her left foot. No deformity.  Lymphadenopathy:    She has no cervical adenopathy.  Neurological: She is alert and oriented to person, place, and time. No cranial nerve deficit. Coordination normal.  She is alert and oriented 3. Speech is currently coherent. Sensation is intact to her bilateral upper and lower extremities. Good strength to her bilateral upper and lower extremities.  Skin: Skin is warm and dry. Capillary refill takes less than 2 seconds. No rash noted. She is not diaphoretic. No erythema. No pallor.  Psychiatric: She has a normal mood and affect. Her behavior is normal.  Nursing note and vitals reviewed.    ED Treatments / Results  Labs (all labs  ordered are listed, but only abnormal results are displayed) Labs Reviewed - No data to display  EKG  EKG Interpretation None       Radiology Dg Ribs Unilateral W/chest Right  Result Date: 01/04/2016 CLINICAL DATA:  Fall, right rib pain EXAM: RIGHT RIBS AND CHEST - 3+ VIEW COMPARISON:  None. FINDINGS: Lungs are clear.  No pleural effusion or pneumothorax. The heart is normal in size. No displaced right rib fracture is seen. IMPRESSION: No evidence of acute cardiopulmonary disease. No displaced right rib fracture is seen. Electronically Signed   By: 01/06/2016 M.D.   On: 01/04/2016 11:59   Dg Foot Complete Left  Result Date: 01/04/2016 CLINICAL DATA:  01/06/2016 several days ago with lateral foot pain EXAM: LEFT FOOT - COMPLETE 3+ VIEW COMPARISON:  None. FINDINGS: There is a minimally displaced fracture through the neck of the left fifth metatarsal with soft tissue swelling. No other acute fracture is seen. Degenerative change is noted at the left first MTP joint with some loss of joint space, spurring, and subchondral cyst formation. A  small plantar calcaneal degenerative spur is present. IMPRESSION: 1. Acute minimally displaced fracture through the neck of the left fifth metatarsal. 2. 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 Electronically Signed   By: Dwyane Dee M.D.   On: 01/04/2016 11:59    Procedures Procedures (including critical care time)  Medications Ordered in ED Medications  HYDROcodone-acetaminophen (NORCO/VICODIN) 5-325 MG per tablet 1 tablet (1 tablet Oral Given 01/04/16 1214)     Initial Impression / Assessment and Plan / ED Course  I have reviewed the triage vital signs and the nursing notes.  Pertinent labs & imaging results that were available during my care of the patient were reviewed by me and considered in my medical decision making (see chart for details).  Clinical Course   This is a 45 y.o. Female who presents to the emergency  department after a fall 3 days ago. Patient reports he went up into her attic to reset her air conditioning unit and when coming back down she slipped on the steps and fell on her right side. She denies hitting her head or LOC. She reports right lateral neck pain, right sided rib pain, right hip pain, and left foot pain. She reports taking some Tylenol for his symptoms with little relief. No treatments prior to arrival. Patient denies fevers, head injury, loss of consciousness, numbness, tingling, weakness. No SOB.  On exam patient is afebrile nontoxic appearing. She is some mild tenderness to her right lateral chest wall. No crepitus. Symmetric chest expansion bilaterally. No overlying skin changes. She also has some mild tenderness over her right hip. No pain with range of motion at her hip. Low suspicion for any fracture. Patient is been ambulatory for 3 days. Patient also has tenderness overlying the dorsal aspect of her left foot. There is mild edema overlying without deformity. She is neurovascularly intact. X-rays of her right ribs the chest are unremarkable. Left foot x-ray shows a nondisplaced fifth metatarsal fracture. Will place in a postop shoe and provided with crutches. I advised her to be nonweightbearing and follow-up with orthopedic surgeon Dr. Magnus Ivan. Naproxen and ice for pain control. I discussed return precautions. I advised the patient to follow-up with their primary care provider this week. I advised the patient to return to the emergency department with new or worsening symptoms or new concerns. The patient verbalized understanding and agreement with plan.     Final Clinical Impressions(s) / ED Diagnoses   Final diagnoses:  Fall, initial encounter  Closed nondisplaced fracture of fifth metatarsal bone of left foot, initial encounter  Contusion of rib on right side, initial encounter    New Prescriptions New Prescriptions   NAPROXEN (NAPROSYN) 250 MG TABLET    Take 1 tablet  (250 mg total) by mouth 2 (two) times daily with a meal.     Everlene Farrier, PA-C 01/04/16 1221    Nelva Nay, MD 01/07/16 [phone]

## 2016-01-04 NOTE — ED Triage Notes (Signed)
She fell off the attic ladder 3 days ago. She fell onto carpet. Pain in her right hip, right side of her neck and left foot. She has been taking Tylenol for the pain.

## 2016-01-05 ENCOUNTER — Ambulatory Visit (INDEPENDENT_AMBULATORY_CARE_PROVIDER_SITE_OTHER): Payer: Self-pay | Admitting: Family

## 2016-11-21 ENCOUNTER — Encounter (HOSPITAL_BASED_OUTPATIENT_CLINIC_OR_DEPARTMENT_OTHER): Payer: Self-pay | Admitting: *Deleted

## 2016-11-21 ENCOUNTER — Emergency Department (HOSPITAL_BASED_OUTPATIENT_CLINIC_OR_DEPARTMENT_OTHER)
Admission: EM | Admit: 2016-11-21 | Discharge: 2016-11-21 | Disposition: A | Discharge status: Home / Self Care | Payer: Medicaid Other | Attending: Emergency Medicine | Admitting: Emergency Medicine

## 2016-11-21 DIAGNOSIS — Z7984 Long term (current) use of oral hypoglycemic drugs: Secondary | ICD-10-CM | POA: Diagnosis not present

## 2016-11-21 DIAGNOSIS — I10 Essential (primary) hypertension: Secondary | ICD-10-CM | POA: Diagnosis not present

## 2016-11-21 DIAGNOSIS — F17210 Nicotine dependence, cigarettes, uncomplicated: Secondary | ICD-10-CM | POA: Insufficient documentation

## 2016-11-21 DIAGNOSIS — J029 Acute pharyngitis, unspecified: Secondary | ICD-10-CM | POA: Diagnosis not present

## 2016-11-21 DIAGNOSIS — Z79899 Other long term (current) drug therapy: Secondary | ICD-10-CM | POA: Insufficient documentation

## 2016-11-21 DIAGNOSIS — E119 Type 2 diabetes mellitus without complications: Secondary | ICD-10-CM | POA: Diagnosis not present

## 2016-11-21 LAB — RAPID STREP SCREEN (MED CTR MEBANE ONLY): STREPTOCOCCUS, GROUP A SCREEN (DIRECT): NEGATIVE

## 2016-11-21 MED ORDER — BENZONATATE 100 MG PO CAPS: 100.0000 mg | ORAL_CAPSULE | Freq: Three times a day (TID) | ORAL | 0 refills | Status: DC

## 2016-11-21 MED ORDER — ALBUTEROL SULFATE HFA 108 (90 BASE) MCG/ACT IN AERS: 1.0000 | INHALATION_SPRAY | Freq: Four times a day (QID) | RESPIRATORY_TRACT | 0 refills | Status: AC | PRN

## 2016-11-21 NOTE — ED Provider Notes (Signed)
[facility] Provider Note   CSN: [id number] Arrival date & time: 11/21/16  1724     History   Chief Complaint Chief Complaint  Patient presents with  . Sore Throat    HPI Jeanette Potts is a 46 y.o. female.  HPI  Patient, with a past medical history of diabetes and hypertension, presents to ED for evaluation of sore throat for the past 3 days. She does have a history of sick contacts with similar symptoms. She has not tried any medications for pain. She reports associated dry cough, rhinorrhea. She denies any fevers, chills, nausea, vomiting, facial swelling, trouble breathing, chest pain, trouble swallowing, drooling or trismus.  Past Medical History:  Diagnosis Date  . Diabetes mellitus   . Hypertension     There are no active problems to display for this patient.   History reviewed. No pertinent surgical history.  OB History    No data available       Home Medications    Prior to Admission medications   Medication Sig Start Date End Date Taking? Authorizing Provider  GLIPIZIDE PO Take by mouth.   Yes [provider]  hydrochlorothiazide (MICROZIDE) 12.5 MG capsule Take 12.5 mg by mouth daily.   Yes [provider]  Liraglutide (VICTOZA [place]) Inject into the skin.   Yes [provider]  metoprolol succinate (TOPROL-XL) 25 MG 24 hr tablet Take 25 mg by mouth 2 (two) times daily.   Yes [provider]  Sertraline HCl (ZOLOFT PO) Take by mouth.   Yes [provider]  acetaminophen (TYLENOL) 325 MG tablet Take 650 mg by mouth every 6 (six) hours as needed. Patient used this medication for the pain in her chest.    [provider]  albuterol (PROVENTIL HFA;VENTOLIN HFA) 108 (90 Base) MCG/ACT inhaler Inhale 1-2 puffs into the lungs every 6 (six) hours as needed for wheezing or shortness of breath. 11/21/16   Calianne Larue, PA-C  benzonatate (TESSALON) 100 MG capsule Take 1 capsule (100 mg total) by mouth every  8 (eight) hours. 11/21/16   Orestes Geiman, PA-C  CLONAZEPAM PO Take by mouth.    [provider]  lisinopril (PRINIVIL,ZESTRIL) 10 MG tablet Take 1 tablet (10 mg total) by mouth daily. 11/24/12   Susy Frizzle, MD  metFORMIN (GLUCOPHAGE) 500 MG tablet Take by mouth 2 (two) times daily with a meal.    [provider]  naproxen (NAPROSYN) 250 MG tablet Take 1 tablet (250 mg total) by mouth 2 (two) times daily with a meal. 01/04/16   Everlene Farrier, PA-C    Family History No family history on file.  Social History Social History  Substance Use Topics  . Smoking status: Current Every Day Smoker    Packs/day: 0.50    Types: Cigarettes  . Smokeless tobacco: Never Used  . Alcohol use No     Allergies   Patient has no known allergies.   Review of Systems Review of Systems  Constitutional: Negative for chills and fever.  HENT: Positive for congestion, rhinorrhea and sore throat. Negative for drooling, facial swelling, mouth sores, trouble swallowing and voice change.   Respiratory: Negative for cough.   Gastrointestinal: Negative for nausea and vomiting.     Physical Exam Updated Vital Signs BP 112/87   Pulse 82   Temp 98.2 F (36.8 C) (Oral)   Resp 20   Ht 5\' 4"  (1.626 m)   Wt 85.7 kg (189 lb)   SpO2 95%  BMI 32.44 kg/m   Physical Exam  Constitutional: She appears well-developed and well-nourished. No distress.  HENT:  Head: Normocephalic and atraumatic.  Patient does not appear to be in acute distress. No trismus or drooling present. No pooling of secretions. Patient is tolerating secretions and is not in respiratory distress. No neck pain or tenderness to palpation of the neck. Full active and passive range of motion of the neck. No evidence of RPA or PTA.  Eyes: Conjunctivae and EOM are normal. No scleral icterus.  Neck: Normal range of motion.  Pulmonary/Chest: Effort normal. No respiratory distress.  Neurological: She is alert.  Skin: No rash  noted. She is not diaphoretic.  Psychiatric: She has a normal mood and affect.  Nursing note and vitals reviewed.    ED Treatments / Results  Labs (all labs ordered are listed, but only abnormal results are displayed) Labs Reviewed  RAPID STREP SCREEN (NOT AT [facility])  CULTURE, GROUP A STREP [facility])    EKG  EKG Interpretation None       Radiology No results found.  Procedures Procedures (including critical care time)  Medications Ordered in ED Medications - No data to display   Initial Impression / Assessment and Plan / ED Course  I have reviewed the triage vital signs and the nursing notes.  Pertinent labs & imaging results that were available during my care of the patient were reviewed by me and considered in my medical decision making (see chart for details).     Patient presents to ED for evaluation of sore throat for the past 2 days. She also reports other URI symptoms. On physical exam there are no tonsillar exudates or swelling noted. She has full active and passive range of motion of neck and is afebrile with no history of fever. Strep test returned as negative. Low suspicion for RPA or PTA being the cause of her sore throat. She is tolerating secretions with no trismus, drooling. Will give symptomatic treatment for viral pharyngitis with Tessalon Perles. Encouraged to follow-up with PCP for further evaluation if symptoms persist. Patient requests refill of her albuterol as well. Patient appears stable for discharge at this time. Strict return precautions given.  Final Clinical Impressions(s) / ED Diagnoses   Final diagnoses:  Viral pharyngitis    New Prescriptions New Prescriptions   ALBUTEROL (PROVENTIL HFA;VENTOLIN HFA) 108 (90 BASE) MCG/ACT INHALER    Inhale 1-2 puffs into the lungs every 6 (six) hours as needed for wheezing or shortness of breath.   BENZONATATE (TESSALON) 100 MG CAPSULE    Take 1 capsule (100 mg total) by mouth every 8 (eight) hours.       Dietrich Pates, PA-C 11/22/16 [id number]    Arby Barrette, MD 11/28/16 1929

## 2016-11-21 NOTE — Discharge Instructions (Signed)
Please read attached information regarding your condition. Take Tessalon Perles as needed for cough. Follow-up with PCP for further evaluation. Return to ED for worsening sore throat, trouble breathing, trouble swallowing, trouble moving neck, signs of severe infection.

## 2016-11-21 NOTE — ED Notes (Signed)
ED Provider at bedside. 

## 2016-11-21 NOTE — ED Triage Notes (Signed)
Sore throat. Sinus drainage.

## 2016-11-24 LAB — CULTURE, GROUP A STREP (THRC)

## 2017-04-02 ENCOUNTER — Encounter: Payer: Self-pay | Admitting: Neurology

## 2017-04-02 ENCOUNTER — Encounter: Payer: Medicaid Other | Admitting: Neurology

## 2017-04-02 ENCOUNTER — Telehealth: Payer: Self-pay | Admitting: Neurology

## 2017-04-02 NOTE — Telephone Encounter (Signed)
This patient did not show for a outside referral EMG and nerve conduction study evaluation.

## 2018-06-10 IMAGING — DX DG FOOT COMPLETE 3+V*L*
3 series · 3 of 3 positions shown · non-contrast
Comparison: None.

CLINICAL DATA: Fell several days ago with lateral foot pain

EXAM:
LEFT FOOT - COMPLETE 3+ VIEW

[foot ap]
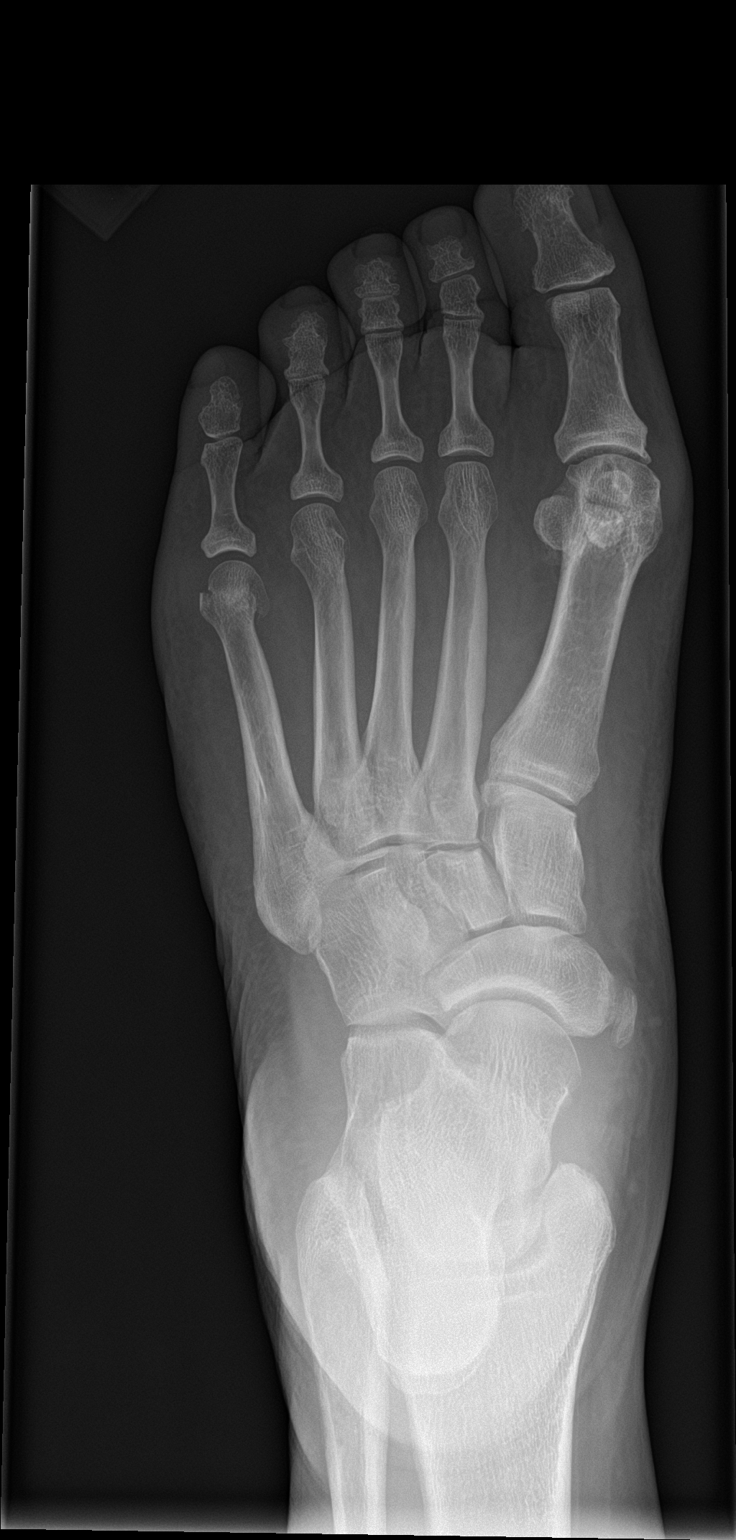

[foot obl]
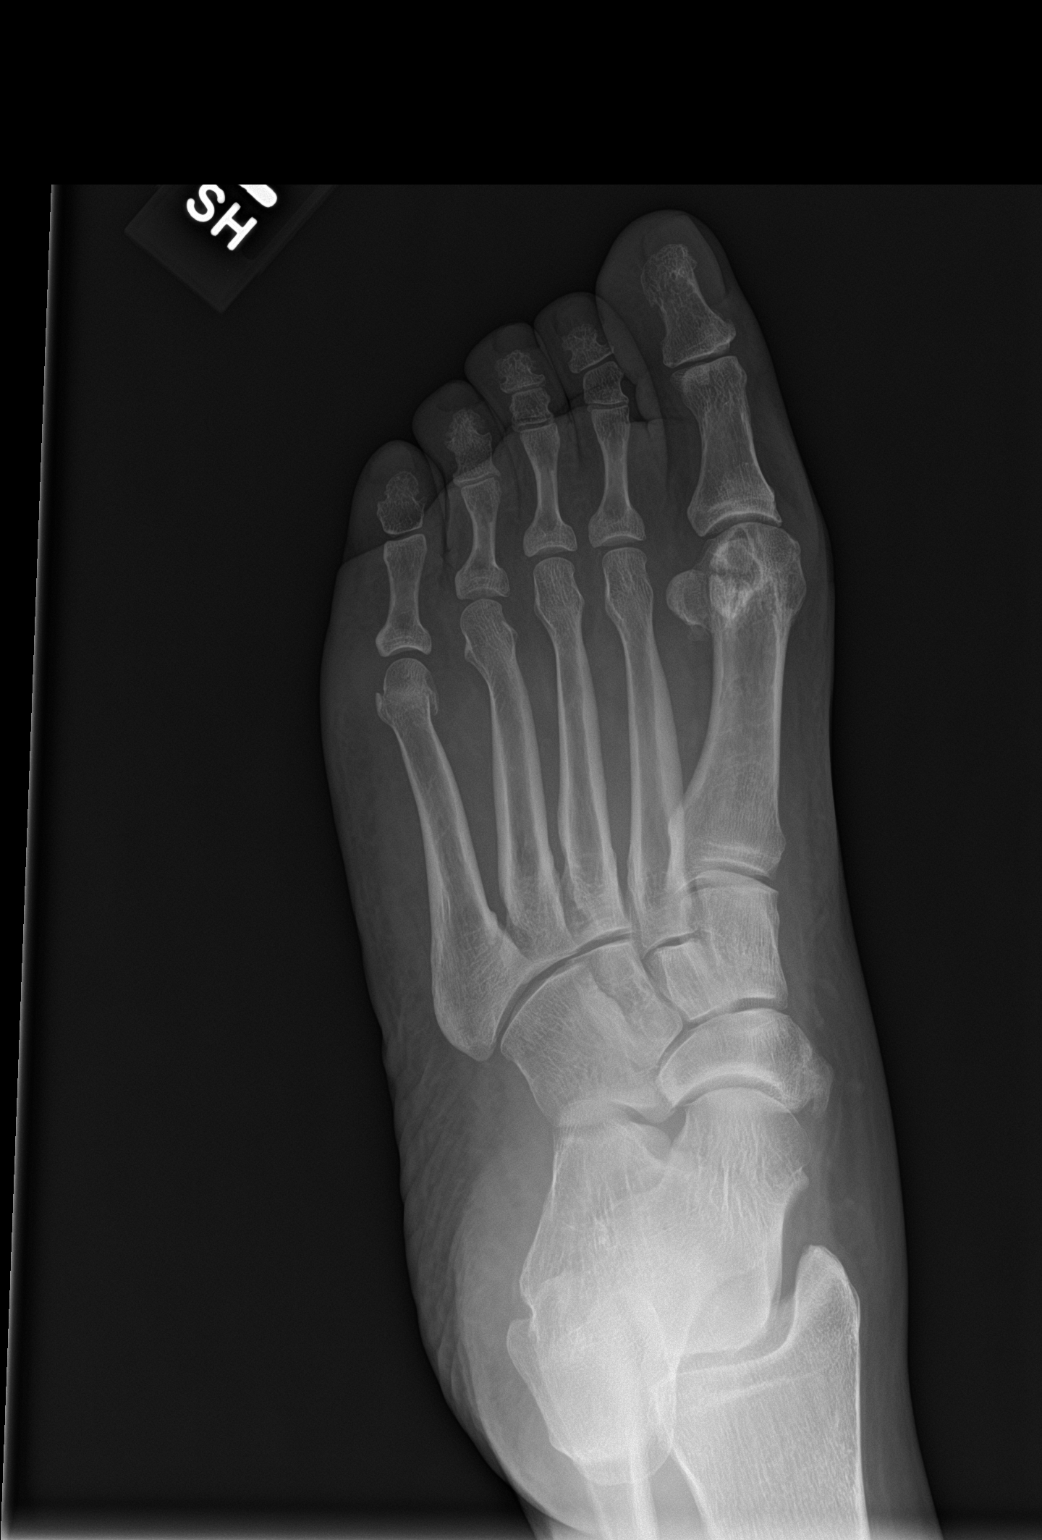

[foot lat]
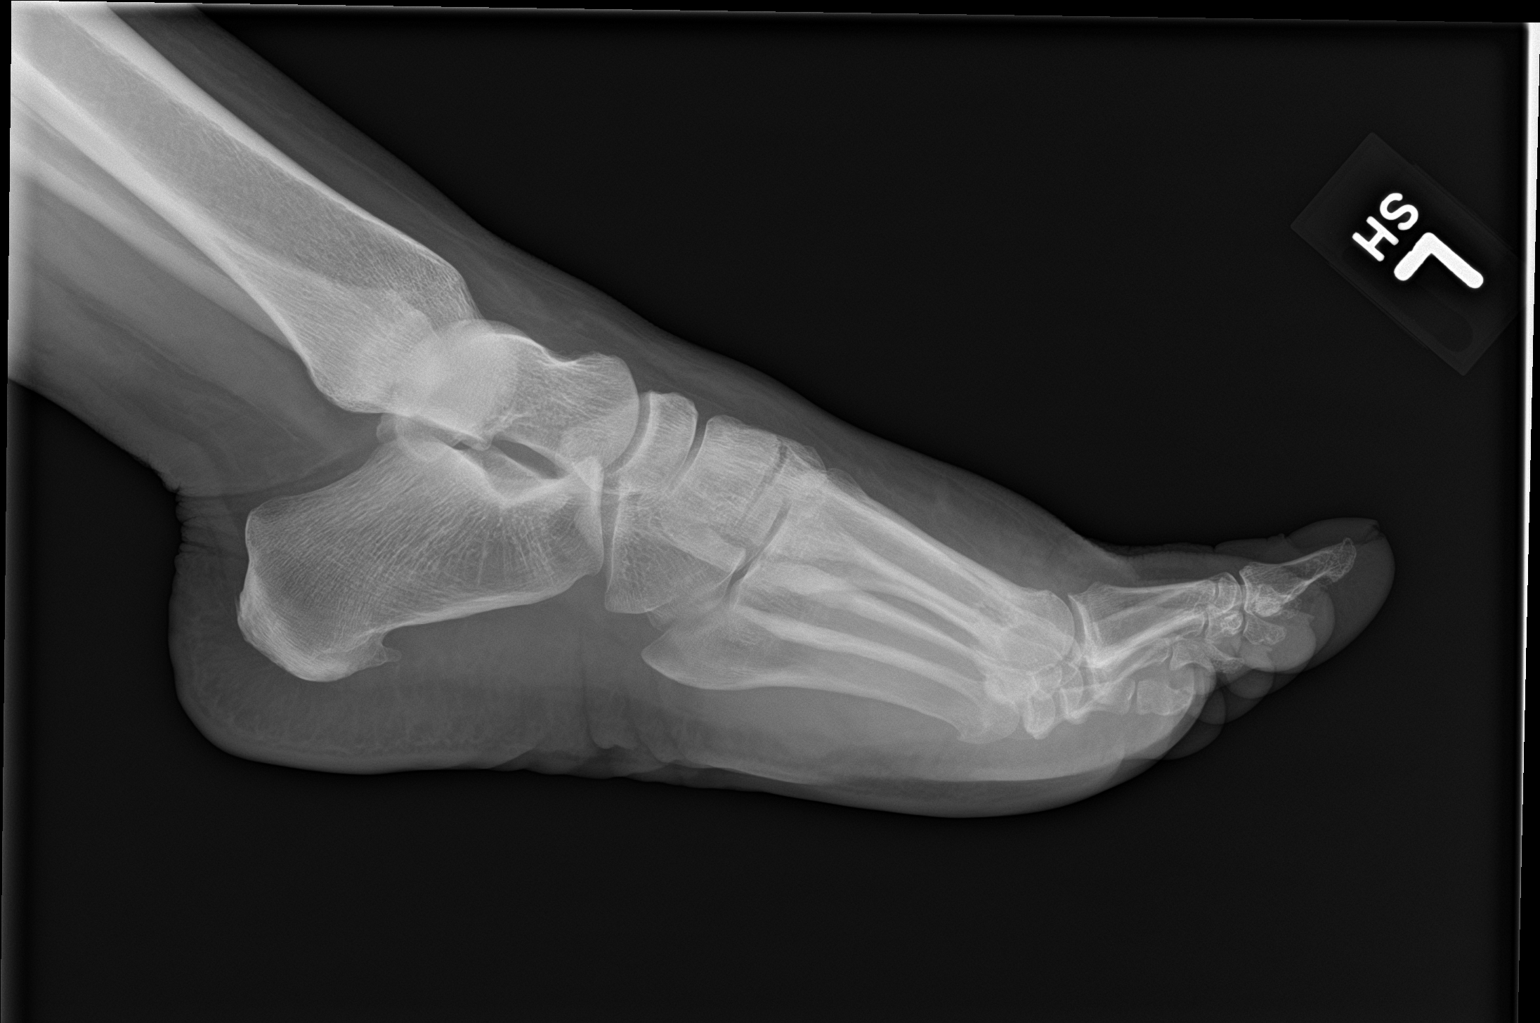

[3 of 3 positions shown; findings below may reference images not displayed]

FINDINGS: There is a minimally displaced fracture through the neck of the left
fifth metatarsal with soft tissue swelling. No other acute fracture
is seen. Degenerative change is noted at the left first MTP joint
with some loss of joint space, spurring, and subchondral cyst
formation. A small plantar calcaneal degenerative spur is present.
IMPRESSION: 1. Acute minimally displaced fracture through the neck of the left
fifth metatarsal.
2. Degenerative joint disease involves the left first MTP joint.

## 2019-06-25 NOTE — Progress Notes (Signed)
Office Visit Note  Patient: Jeanette Potts             Date of Birth: 1970/04/08           MRN: [id number]             PCP: Vassie Moment, MD Referring: Vassie Moment, MD Visit Date: 07/01/2019 Occupation: '@GUAROCC' @  Subjective:  Pain in all the joints and muscles.   History of Present Illness: Jeanette Potts is a 49 y.o. female seen in consultation per request of her PCP.  According to the patient her symptoms a started about 4 to 5 years ago with frequency of urination rash under her skin and swelling on her feet and ankles.  At the time she was seen at [facility].  She was diagnosed with diabetes and questionable neuropathy.  Over time her pain got worse.  She states pain was initially in her lower back and her thighs.  And there was a suspicion of fibromyalgia.  She was started on several medications but her pain did not get better.  She has been having pain in her shoulders, elbows, hands,  hip joints, knees, ankles and feet.  She has noticed swelling over her ankles and feet.  Her neck and lower back continues to hurt.  She states she is in constant pain despite taking all the medications.  There is positive family history of rheumatoid arthritis in her mother.  She is gravida 3, para 3.  Activities of Daily Living:  Patient reports morning stiffness for 24 hours.   Patient Denies nocturnal pain.  Difficulty dressing/grooming: Reports Difficulty climbing stairs: Reports Difficulty getting out of chair: Reports Difficulty using hands for taps, buttons, cutlery, and/or writing: Reports  Review of Systems  Constitutional: Positive for fatigue. Negative for night sweats, weight gain and weight loss.  HENT: Positive for mouth dryness. Negative for mouth sores, trouble swallowing, trouble swallowing and nose dryness.   Eyes: Negative for pain, redness, visual disturbance and dryness.  Respiratory: Negative for cough, shortness of breath and difficulty breathing.     Cardiovascular: Positive for swelling in legs/feet. Negative for chest pain, palpitations, hypertension and irregular heartbeat.  Gastrointestinal: Positive for constipation and diarrhea. Negative for blood in stool.  Endocrine: Positive for increased urination. Negative for cold intolerance, heat intolerance and excessive thirst.  Genitourinary: Negative for difficulty urinating and vaginal dryness.  Musculoskeletal: Positive for arthralgias, gait problem, joint pain, joint swelling, morning stiffness and muscle tenderness. Negative for myalgias, muscle weakness and myalgias.  Skin: Positive for rash. Negative for color change, hair loss, skin tightness, ulcers and sensitivity to sunlight.  Allergic/Immunologic: Negative for susceptible to infections.  Neurological: Positive for numbness. Negative for dizziness, memory loss, night sweats and weakness.  Hematological: Negative for bruising/bleeding tendency and swollen glands.  Psychiatric/Behavioral: Positive for depressed mood and sleep disturbance. The patient is nervous/anxious.     PMFS History:  Patient Active Problem List   Diagnosis Date Noted  . Chronic pain syndrome 07/01/2019  . Anxiety and depression 07/01/2019  . History of gastroesophageal reflux (GERD) 07/01/2019  . History of type 2 diabetes mellitus 07/01/2019  . Essential hypertension 07/01/2019  . Elevated cholesterol 07/01/2019    Past Medical History:  Diagnosis Date  . Diabetes mellitus   . Hypertension   . Rheumatoid arthritis ([place])     Family History  Problem Relation Age of Onset  . Diabetes Mother   . Hypertension Mother   . Rheum arthritis Mother   .  Heart attack Father   . Hypertension Brother    History reviewed. No pertinent surgical history. Social History   Social History Narrative  . Not on file    There is no immunization history on file for this patient.   Objective: Vital Signs: BP (!) 158/104 (BP Location: Right Arm, Patient  Position: Sitting, Cuff Size: Normal)   Pulse (!) 101   Resp 18   Ht 5' 4.5" (1.638 m)   Wt 217 lb (98.4 kg)   BMI 36.67 kg/m    Physical Exam Vitals and nursing note reviewed.  Constitutional:      Appearance: She is well-developed.  HENT:     Head: Normocephalic and atraumatic.  Eyes:     Conjunctiva/sclera: Conjunctivae normal.  Cardiovascular:     Rate and Rhythm: Normal rate and regular rhythm.     Heart sounds: Normal heart sounds.  Pulmonary:     Effort: Pulmonary effort is normal.     Breath sounds: Normal breath sounds.  Abdominal:     General: Bowel sounds are normal.     Palpations: Abdomen is soft.  Musculoskeletal:     Cervical back: Normal range of motion.  Lymphadenopathy:     Cervical: No cervical adenopathy.  Skin:    General: Skin is warm and dry.     Capillary Refill: Capillary refill takes less than 2 seconds.  Neurological:     Mental Status: She is alert and oriented to person, place, and time.  Psychiatric:        Behavior: Behavior normal.      Musculoskeletal Exam: C-spine was in good range of motion.  She has painful limited range of motion of the lumbar spine.  Shoulder joints, elbow joints, wrist joints MCPs PIPs and DIPs with good range of motion with no synovitis.  She had tenderness across her MCPs and PIPs.  Hip joints, knee joints, ankles, MTPs and PIPs with good range of motion with no synovitis.  She has tenderness over MTPs and PIPs.  She had discomfort range of motion of her hip joints.  She had edema over bilateral ankle joints.  She has generalized hyperalgesia and positive tender points.  CDAI Exam: CDAI Score: -- Patient Global: --; Provider Global: -- Swollen: --; Tender: -- Joint Exam 07/01/2019   No joint exam has been documented for this visit   There is currently no information documented on the homunculus. Go to the Rheumatology activity and complete the homunculus joint exam.  Investigation: No additional  findings.  Imaging: XR HIPS BILAT W OR W/O PELVIS 3-4 VIEWS  Result Date: 07/01/2019 No significant narrowing was noted in bilateral hip joints.  SI joints were unremarkable. Impression: Unremarkable x-ray of the hip joints and SI joints.  XR Foot 2 Views Left  Result Date: 07/01/2019 First MTP, PIP and DIP narrowing was noted.  Possible injury versus erosion was noted to the fifth metatarsal head.  Dorsal spurring and inferior calcaneal spur was noted.  No intertarsal or tibiotalar joint space narrowing was noted. Impression: These findings are consistent with osteoarthritis and possible rheumatoid arthritis with the right fifth MTP erosion.  XR Foot 2 Views Right  Result Date: 07/01/2019 First MTP, PIP and DIP narrowing was noted.  No other MTP intertarsal or tibiotalar joint space narrowing was noted.  No erosive changes were noted.  Inferior calcaneal spur was noted.  Dorsal spurring was noted. Impression: These findings are consistent with osteoarthritis of the foot.  XR Hand 2 View Left  Result Date: 07/01/2019 Juxta-articular osteopenia was noted.  Erosive changes noted in the second MCP joint.  PIP and DIP narrowing was noted.  Radiocarpal joint space narrowing with cystic versus erosive changes were noted. 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  XR Hand 2 View Right  Result Date: 07/01/2019 Juxta-articular osteopenia was noted.  Mild PIP and DIP narrowing was noted.  No MCP, intercarpal radiocarpal joint space narrowing was noted.  Some cystic versus erosive changes were noted in the carpal bones. Impression: These findings are consistent with rheumatoid arthritis with possible erosive changes..  XR Lumbar Spine 2-3 Views  Result Date: 07/01/2019 No significant disc space narrowing was noted.  No syndesmophytes were noted.  Facet joint arthropathy was noted. Impression: These findings are consistent with lumbar facet joint  arthropathy.   Recent Labs: Lab Results  Component Value Date   WBC 6.3 09/14/2014   HGB 13.1 09/14/2014   PLT 283 09/14/2014   NA 140 09/14/2014   K 3.9 09/14/2014   CL 107 09/14/2014   CO2 26 09/14/2014   GLUCOSE 104 (H) 09/14/2014   BUN 10 09/14/2014   CREATININE 0.61 09/14/2014   BILITOT 0.4 07/01/2011   ALKPHOS 61 07/01/2011   AST 16 07/01/2011   ALT 12 07/01/2011   PROT 7.9 07/01/2011   ALBUMIN 3.8 07/01/2011   CALCIUM 8.9 09/14/2014   GFRAA >60 09/14/2014    Speciality Comments: No specialty comments available.  Procedures:  No procedures performed Allergies: Patient has no known allergies.   Assessment / Plan:     Visit Diagnoses: Polyarthralgia - Evaluated at [facility] in 2019-Positive RF and elevated ESR at that time no diagnosis was established.  Pain in both hands -she complains of pain and discomfort in her bilateral hands.  No synovitis was noted.  Plan: XR Hand 2 View Right, XR Hand 2 View Left, x-ray findings were consistent with erosive rheumatoid arthritis.  Sedimentation rate, Rheumatoid factor, Cyclic citrul peptide antibody, IgG, 14-3-3 eta Protein, ANA  Chronic pain of both hips -she complains of severe pain in her bilateral hip joints.  Her hip joints were in full range of motion.  Plan: XR HIPS BILAT W OR W/O PELVIS 3-4 VIEWS.  X-ray of bilateral hip joints were unremarkable.  Pain in both feet -she complains of pain and discomfort in her bilateral feet.  She had bilaterally ankle edema but no synovitis noted over MTPs and PIPs.  Plan: XR Foot 2 Views Right, XR Foot 2 Views Left.  X-rays were consistent with osteoarthritis.  Possible right fifth MTP erosion was noted.  Chronic midline low back pain without sciatica -she has severe lower back pain.  She states she has never been evaluated for lower back pain.  Plan: XR Lumbar Spine 2-3 Views.  X-ray showed facet joint arthropathy.  Rheumatoid factor positive - 12/18/17: RF 114.5, ESR 66, ANA negative,  vitamin D 11.6.  There is positive family history of rheumatoid arthritis in patient's mother according to patient she is not on any treatment.  Other fatigue - Plan: CBC with Differential/Platelet, COMPLETE METABOLIC PANEL WITH GFR, CK, TSH, Serum protein electrophoresis with reflex, Glucose 6 phosphate dehydrogenase, VITAMIN D 25 Hydroxy (Vit-D Deficiency, Fractures)  Vitamin D deficiency - Plan: VITAMIN D 25 Hydroxy (Vit-D Deficiency, Fractures)  Chronic pain syndrome - Oxycodone and tramadol   Anxiety and depression-she has been treated.  History of gastroesophageal reflux (GERD)  History of type 2 diabetes mellitus  Essential hypertension  Elevated cholesterol  Family history of rheumatoid arthritis - Mother  Orders: Orders Placed This Encounter  Procedures  . XR Hand 2 View Right  . XR Hand 2 View Left  . XR Foot 2 Views Right  . XR Foot 2 Views Left  . XR HIPS BILAT W OR W/O PELVIS 3-4 VIEWS  . XR Lumbar Spine 2-3 Views  . CBC with Differential/Platelet  . COMPLETE METABOLIC PANEL WITH GFR  . Sedimentation rate  . CK  . TSH  . Rheumatoid factor  . Cyclic citrul peptide antibody, IgG  . 14-3-3 eta Protein  . ANA  . Serum protein electrophoresis with reflex  . Glucose 6 phosphate dehydrogenase  . VITAMIN D 25 Hydroxy (Vit-D Deficiency, Fractures)  . Hepatitis B core antibody, IgM  . Hepatitis B surface antigen  . Hepatitis C antibody  . HIV Antibody (routine testing w rflx)  . QuantiFERON-TB Gold Plus  . IgG, IgA, IgM   No orders of the defined types were placed in this encounter.   Face-to-face time spent with patient was 50 minutes. Greater than 50% of time was spent in counseling and coordination of care.  Follow-Up Instructions: Return for FMS, RA.   Bo Merino, MD  Note - This record has been created using Editor, commissioning.  Chart creation errors have been sought, but may not always  have been located. Such creation errors do not reflect on   the standard of medical care.

## 2019-07-01 ENCOUNTER — Ambulatory Visit: Payer: Self-pay

## 2019-07-01 ENCOUNTER — Encounter: Payer: Self-pay | Admitting: Rheumatology

## 2019-07-01 ENCOUNTER — Other Ambulatory Visit: Payer: Self-pay

## 2019-07-01 ENCOUNTER — Ambulatory Visit (INDEPENDENT_AMBULATORY_CARE_PROVIDER_SITE_OTHER): Payer: 59 | Admitting: Rheumatology

## 2019-07-01 ENCOUNTER — Encounter (INDEPENDENT_AMBULATORY_CARE_PROVIDER_SITE_OTHER): Payer: Self-pay

## 2019-07-01 VITALS — BP 158/104 | HR 101 | Resp 18 | Ht 64.5 in | Wt 217.0 lb

## 2019-07-01 DIAGNOSIS — M79642 Pain in left hand: Secondary | ICD-10-CM | POA: Diagnosis not present

## 2019-07-01 DIAGNOSIS — R5383 Other fatigue: Secondary | ICD-10-CM

## 2019-07-01 DIAGNOSIS — M79672 Pain in left foot: Secondary | ICD-10-CM | POA: Diagnosis not present

## 2019-07-01 DIAGNOSIS — M25551 Pain in right hip: Secondary | ICD-10-CM | POA: Diagnosis not present

## 2019-07-01 DIAGNOSIS — M79641 Pain in right hand: Secondary | ICD-10-CM | POA: Diagnosis not present

## 2019-07-01 DIAGNOSIS — G8929 Other chronic pain: Secondary | ICD-10-CM | POA: Diagnosis not present

## 2019-07-01 DIAGNOSIS — M79671 Pain in right foot: Secondary | ICD-10-CM | POA: Diagnosis not present

## 2019-07-01 DIAGNOSIS — Z8639 Personal history of other endocrine, nutritional and metabolic disease: Secondary | ICD-10-CM

## 2019-07-01 DIAGNOSIS — M545 Low back pain, unspecified: Secondary | ICD-10-CM

## 2019-07-01 DIAGNOSIS — Z8261 Family history of arthritis: Secondary | ICD-10-CM

## 2019-07-01 DIAGNOSIS — Z8719 Personal history of other diseases of the digestive system: Secondary | ICD-10-CM

## 2019-07-01 DIAGNOSIS — F32A Depression, unspecified: Secondary | ICD-10-CM

## 2019-07-01 DIAGNOSIS — E7800 Pure hypercholesterolemia, unspecified: Secondary | ICD-10-CM

## 2019-07-01 DIAGNOSIS — M25552 Pain in left hip: Secondary | ICD-10-CM | POA: Diagnosis not present

## 2019-07-01 DIAGNOSIS — E559 Vitamin D deficiency, unspecified: Secondary | ICD-10-CM

## 2019-07-01 DIAGNOSIS — R768 Other specified abnormal immunological findings in serum: Secondary | ICD-10-CM

## 2019-07-01 DIAGNOSIS — M2550 Pain in unspecified joint: Secondary | ICD-10-CM | POA: Diagnosis not present

## 2019-07-01 DIAGNOSIS — F419 Anxiety disorder, unspecified: Secondary | ICD-10-CM

## 2019-07-01 DIAGNOSIS — G894 Chronic pain syndrome: Secondary | ICD-10-CM

## 2019-07-01 DIAGNOSIS — I10 Essential (primary) hypertension: Secondary | ICD-10-CM

## 2019-07-01 DIAGNOSIS — Z79899 Other long term (current) drug therapy: Secondary | ICD-10-CM

## 2019-07-01 DIAGNOSIS — F329 Major depressive disorder, single episode, unspecified: Secondary | ICD-10-CM

## 2019-07-01 DIAGNOSIS — R7689 Other specified abnormal immunological findings in serum: Secondary | ICD-10-CM

## 2019-07-02 ENCOUNTER — Other Ambulatory Visit: Payer: Self-pay

## 2019-07-02 DIAGNOSIS — E559 Vitamin D deficiency, unspecified: Secondary | ICD-10-CM

## 2019-07-02 MED ORDER — VITAMIN D (ERGOCALCIFEROL) 1.25 MG (50000 UNIT) PO CAPS: 50000.0000 [IU] | ORAL_CAPSULE | ORAL | 0 refills | Status: DC

## 2019-07-03 LAB — QUANTIFERON-TB GOLD PLUS
Mitogen-NIL: 7.45 IU/mL
NIL: 0.02 IU/mL
QuantiFERON-TB Gold Plus: NEGATIVE
TB1-NIL: 0.01 IU/mL
TB2-NIL: 0 IU/mL

## 2019-07-03 LAB — HEPATITIS B SURFACE ANTIGEN: Hepatitis B Surface Ag: NONREACTIVE

## 2019-07-03 LAB — HIV ANTIBODY (ROUTINE TESTING W REFLEX): HIV 1&2 Ab, 4th Generation: NONREACTIVE

## 2019-07-03 LAB — HEPATITIS B CORE ANTIBODY, IGM: Hep B C IgM: NONREACTIVE

## 2019-07-03 LAB — IGG, IGA, IGM
IgG (Immunoglobin G), Serum: 1598 mg/dL (ref 600–1640)
IgM, Serum: 219 mg/dL (ref 50–300)
Immunoglobulin A: 207 mg/dL (ref 47–310)

## 2019-07-03 LAB — HEPATITIS C ANTIBODY
Hepatitis C Ab: NONREACTIVE
SIGNAL TO CUT-OFF: 0.02 (ref <1.00)

## 2019-07-05 NOTE — Progress Notes (Signed)
Significant anemia noted please forward labs to her PCP.  Add ENA.  Labs are consistent with rheumatoid arthritis.  She had erosive disease on the x-rays.  Please schedule an earlier appointment to discuss treatment options.

## 2019-07-07 ENCOUNTER — Telehealth: Payer: Self-pay | Admitting: Rheumatology

## 2019-07-07 LAB — COMPLETE METABOLIC PANEL WITH GFR
AG Ratio: 1.1 (calc) (ref 1.0–2.5)
ALT: 8 U/L (ref 6–29)
AST: 12 U/L (ref 10–35)
Albumin: 3.9 g/dL (ref 3.6–5.1)
Alkaline phosphatase (APISO): 77 U/L (ref 37–153)
BUN: 7 mg/dL (ref 7–25)
CO2: 27 mmol/L (ref 20–32)
Calcium: 9.4 mg/dL (ref 8.6–10.4)
Chloride: 106 mmol/L (ref 98–110)
Creat: 0.64 mg/dL (ref 0.50–1.05)
GFR, Est African American: 121 mL/min/{1.73_m2} (ref >=60)
GFR, Est Non African American: 104 mL/min/{1.73_m2} (ref >=60)
Globulin: 3.6 g/dL (calc) (ref 1.9–3.7)
Glucose, Bld: 102 mg/dL — ABNORMAL HIGH (ref 65–99)
Potassium: 4.1 mmol/L (ref 3.5–5.3)
Sodium: 141 mmol/L (ref 135–146)
Total Bilirubin: 0.4 mg/dL (ref 0.2–1.2)
Total Protein: 7.5 g/dL (ref 6.1–8.1)

## 2019-07-07 LAB — CBC WITH DIFFERENTIAL/PLATELET
Absolute Monocytes: 464 cells/uL (ref 200–950)
Basophils Absolute: 41 cells/uL (ref 0–200)
Basophils Relative: 0.4 %
Eosinophils Absolute: 52 cells/uL (ref 15–500)
Eosinophils Relative: 0.5 %
HCT: 33.1 % — ABNORMAL LOW (ref 35.0–45.0)
Hemoglobin: 9.5 g/dL — ABNORMAL LOW (ref 11.7–15.5)
Lymphs Abs: 2009 cells/uL (ref 850–3900)
MCH: 18 pg — ABNORMAL LOW (ref 27.0–33.0)
MCHC: 28.7 g/dL — ABNORMAL LOW (ref 32.0–36.0)
MCV: 62.6 fL — ABNORMAL LOW (ref 80.0–100.0)
MPV: 10.4 fL (ref 7.5–12.5)
Monocytes Relative: 4.5 %
Neutro Abs: 7735 cells/uL (ref 1500–7800)
Neutrophils Relative %: 75.1 %
Platelets: 474 10*3/uL — ABNORMAL HIGH (ref 140–400)
RBC: 5.29 10*6/uL — ABNORMAL HIGH (ref 3.80–5.10)
RDW: 21.6 % — ABNORMAL HIGH (ref 11.0–15.0)
Total Lymphocyte: 19.5 %
WBC: 10.3 10*3/uL (ref 3.8–10.8)

## 2019-07-07 LAB — PROTEIN ELECTROPHORESIS, SERUM, WITH REFLEX
Abnormal Protein Band1: 0.7 g/dL — ABNORMAL HIGH
Albumin ELP: 3.9 g/dL (ref 3.8–4.8)
Alpha 1: 0.3 g/dL (ref 0.2–0.3)
Alpha 2: 0.9 g/dL (ref 0.5–0.9)
Beta 2: 0.4 g/dL (ref 0.2–0.5)
Beta Globulin: 0.5 g/dL (ref 0.4–0.6)
Gamma Globulin: 1.6 g/dL (ref 0.8–1.7)
Total Protein: 7.6 g/dL (ref 6.1–8.1)

## 2019-07-07 LAB — TEST AUTHORIZATION

## 2019-07-07 LAB — SJOGREN'S SYNDROME ANTIBODS(SSA + SSB)
SSA (Ro) (ENA) Antibody, IgG: <1 AI
SSB (La) (ENA) Antibody, IgG: <1 AI

## 2019-07-07 LAB — IFE INTERPRETATION: Immunofix Electr Int: DETECTED

## 2019-07-07 LAB — SEDIMENTATION RATE: Sed Rate: 38 mm/h — ABNORMAL HIGH (ref 0–20)

## 2019-07-07 LAB — RHEUMATOID FACTOR: Rheumatoid fact SerPl-aCnc: 194 IU/mL — ABNORMAL HIGH (ref <14)

## 2019-07-07 LAB — 14-3-3 ETA PROTEIN: 14-3-3 eta Protein: 0.3 ng/mL — ABNORMAL HIGH (ref <0.2)

## 2019-07-07 LAB — ANTI-NUCLEAR AB-TITER (ANA TITER): ANA Titer 1: 1:320 {titer} — ABNORMAL HIGH

## 2019-07-07 LAB — SM AND SM/RNP ANTIBODIES
ENA SM Ab Ser-aCnc: <1 AI
SM/RNP: <1 AI

## 2019-07-07 LAB — ANTI-SCLERODERMA ANTIBODY: Scleroderma (Scl-70) (ENA) Antibody, IgG: <1 AI

## 2019-07-07 LAB — ANTI-DNA ANTIBODY, DOUBLE-STRANDED: ds DNA Ab: <1 IU/mL

## 2019-07-07 LAB — TSH: TSH: 1.91 mIU/L

## 2019-07-07 LAB — VITAMIN D 25 HYDROXY (VIT D DEFICIENCY, FRACTURES): Vit D, 25-Hydroxy: 19 ng/mL — ABNORMAL LOW (ref 30–100)

## 2019-07-07 LAB — CK: Total CK: 139 U/L (ref 29–143)

## 2019-07-07 LAB — GLUCOSE 6 PHOSPHATE DEHYDROGENASE: G-6PDH: 28.9 U/g Hgb — ABNORMAL HIGH (ref 7.0–20.5)

## 2019-07-07 LAB — ANA: Anti Nuclear Antibody (ANA): POSITIVE — AB

## 2019-07-07 LAB — CYCLIC CITRUL PEPTIDE ANTIBODY, IGG: Cyclic Citrullin Peptide Ab: >250 UNITS — ABNORMAL HIGH

## 2019-07-07 NOTE — Telephone Encounter (Signed)
Elease Hashimoto from Dr. Raye Sorrow office called equesting patient's office visit notes from 07/01/19 be faxed to their office.  Fax #[phone] Attn:  Elease Hashimoto

## 2019-07-07 NOTE — Telephone Encounter (Signed)
Office note faxed to PCP, Dr. Lazarus Salines.

## 2019-07-16 ENCOUNTER — Ambulatory Visit: Payer: Medicaid Other | Admitting: Rheumatology

## 2019-07-28 NOTE — Progress Notes (Signed)
Office Visit Note  Patient: Jeanette Potts             Date of Birth: May 07, 1970           MRN: [id number]             PCP: Vassie Moment, MD Referring: Vassie Moment, MD Visit Date: 07/30/2019 Occupation: '@GUAROCC' @  Subjective:  Pain in multiple joints  History of Present Illness: Jeanette Potts is a 49 y.o. female with history of seropositive rheumatoid arthritis and lower back pain.  She states she continues to have pain and discomfort in almost all of her joints.  She has increased discomfort in her wrist joints.  She has noticed swelling over bilateral ankle joints behind her left knee and her wrist joints.  She continues to have lower back pain.  She states she has a stiffness lasting almost all day.  She continues to experience a lot of fatigue.  Activities of Daily Living:  Patient reports morning stiffness for  24 hours.   Patient Denies nocturnal pain.  Difficulty dressing/grooming: Reports Difficulty climbing stairs: Reports Difficulty getting out of chair: Reports Difficulty using hands for taps, buttons, cutlery, and/or writing: Reports  Review of Systems  Constitutional: Positive for fatigue. Negative for night sweats, weight gain and weight loss.  HENT: Negative for mouth sores, trouble swallowing, trouble swallowing, mouth dryness and nose dryness.   Eyes: Negative for pain, redness, visual disturbance and dryness.  Respiratory: Negative for cough, shortness of breath and difficulty breathing.   Cardiovascular: Negative for chest pain, palpitations, hypertension, irregular heartbeat and swelling in legs/feet.  Gastrointestinal: Positive for constipation and diarrhea. Negative for blood in stool.  Endocrine: Negative for cold intolerance, heat intolerance, excessive thirst and increased urination.  Genitourinary: Negative for painful urination and vaginal dryness.  Musculoskeletal: Positive for arthralgias, gait problem, joint pain, joint swelling, myalgias, morning  stiffness, muscle tenderness and myalgias. Negative for muscle weakness.  Skin: Negative for color change, rash, hair loss, skin tightness, ulcers and sensitivity to sunlight.  Allergic/Immunologic: Positive for susceptible to infections.  Neurological: Positive for numbness. Negative for dizziness, memory loss, night sweats and weakness.  Hematological: Negative for bruising/bleeding tendency and swollen glands.  Psychiatric/Behavioral: Positive for depressed mood and sleep disturbance. The patient is nervous/anxious.     PMFS History:  Patient Active Problem List   Diagnosis Date Noted  . Chronic pain syndrome 07/01/2019  . Anxiety and depression 07/01/2019  . History of gastroesophageal reflux (GERD) 07/01/2019  . History of type 2 diabetes mellitus 07/01/2019  . Essential hypertension 07/01/2019  . Elevated cholesterol 07/01/2019    Past Medical History:  Diagnosis Date  . Diabetes mellitus   . Hypertension   . Rheumatoid arthritis ([place])     Family History  Problem Relation Age of Onset  . Diabetes Mother   . Hypertension Mother   . Rheum arthritis Mother   . Heart attack Father   . Hypertension Brother    History reviewed. No pertinent surgical history. Social History   Social History Narrative  . Not on file    There is no immunization history on file for this patient.   Objective: Vital Signs: BP (!) 145/90 (BP Location: Left Arm, Patient Position: Sitting, Cuff Size: Normal)   Pulse 94   Resp 16   Ht '5\' 4"'  (1.626 m)   Wt 218 lb 6.4 oz (99.1 kg)   BMI 37.49 kg/m    Physical Exam Vitals and nursing note reviewed.  Constitutional:  Appearance: She is well-developed.  HENT:     Head: Normocephalic and atraumatic.  Eyes:     Conjunctiva/sclera: Conjunctivae normal.  Cardiovascular:     Rate and Rhythm: Normal rate and regular rhythm.     Heart sounds: Normal heart sounds.  Pulmonary:     Effort: Pulmonary effort is normal.     Breath sounds:  Normal breath sounds.  Abdominal:     General: Bowel sounds are normal.     Palpations: Abdomen is soft.  Musculoskeletal:     Cervical back: Normal range of motion.  Lymphadenopathy:     Cervical: No cervical adenopathy.  Skin:    General: Skin is warm and dry.     Capillary Refill: Capillary refill takes less than 2 seconds.  Neurological:     Mental Status: She is alert and oriented to person, place, and time.  Psychiatric:        Behavior: Behavior normal.      Musculoskeletal Exam: C-spine thoracic spine with good range of motion.  She has discomfort range of motion of her lumbar spine.  Shoulder joints and elbow joints with good range of motion.  She has tenderness and synovitis of bilateral wrist joints and MCPs as described below.  She has discomfort in her knee joints without any warmth or swelling.  She is swelling over bilateral ankle joints.  There was no tenderness over MTPs. CDAI Exam: CDAI Score: 15.4  Patient Global: 8 mm; Provider Global: 6 mm Swollen: 8 ; Tender: 10  Joint Exam 07/30/2019      Right  Left  Wrist  Swollen Tender  Swollen Tender  MCP 2  Swollen Tender  Swollen Tender  MCP 3  Swollen Tender  Swollen Tender  Knee   Tender   Tender  Ankle  Swollen Tender  Swollen Tender     Investigation: No additional findings.  Imaging: XR HIPS BILAT W OR W/O PELVIS 3-4 VIEWS  Result Date: 07/01/2019 No significant narrowing was noted in bilateral hip joints.  SI joints were unremarkable. Impression: Unremarkable x-ray of the hip joints and SI joints.  XR Foot 2 Views Left  Result Date: 07/01/2019 First MTP, PIP and DIP narrowing was noted.  Possible injury versus erosion was noted to the fifth metatarsal head.  Dorsal spurring and inferior calcaneal spur was noted.  No intertarsal or tibiotalar joint space narrowing was noted. Impression: These findings are consistent with osteoarthritis and possible rheumatoid arthritis with the right fifth MTP  erosion.  XR Foot 2 Views Right  Result Date: 07/01/2019 First MTP, PIP and DIP narrowing was noted.  No other MTP intertarsal or tibiotalar joint space narrowing was noted.  No erosive changes were noted.  Inferior calcaneal spur was noted.  Dorsal spurring was noted. Impression: These findings are consistent with osteoarthritis of the foot.  XR Hand 2 View Left  Result Date: 07/01/2019 Juxta-articular osteopenia was noted.  Erosive changes noted in the second MCP joint.  PIP and DIP narrowing was noted.  Radiocarpal joint space narrowing with cystic versus erosive changes were noted. 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  XR Hand 2 View Right  Result Date: 07/01/2019 Juxta-articular osteopenia was noted.  Mild PIP and DIP narrowing was noted.  No MCP, intercarpal radiocarpal joint space narrowing was noted.  Some cystic versus erosive changes were noted in the carpal bones. Impression: These findings are consistent with rheumatoid arthritis with possible erosive changes..  XR Lumbar Spine  2-3 Views  Result Date: 07/01/2019 No significant disc space narrowing was noted.  No syndesmophytes were noted.  Facet joint arthropathy was noted. Impression: These findings are consistent with lumbar facet joint arthropathy.   Recent Labs: Lab Results  Component Value Date   WBC 10.3 07/01/2019   HGB 9.5 (L) 07/01/2019   PLT 474 (H) 07/01/2019   NA 141 07/01/2019   K 4.1 07/01/2019   CL 106 07/01/2019   CO2 27 07/01/2019   GLUCOSE 102 (H) 07/01/2019   BUN 7 07/01/2019   CREATININE 0.64 07/01/2019   BILITOT 0.4 07/01/2019   ALKPHOS 61 07/01/2011   AST 12 07/01/2019   ALT 8 07/01/2019   PROT 7.5 07/01/2019   PROT 7.6 07/01/2019   ALBUMIN 3.8 07/01/2011   CALCIUM 9.4 07/01/2019   GFRAA 121 07/01/2019   QFTBGOLDPLUS NEGATIVE 07/01/2019   July 01, 2019 TB Gold negative, immunoglobulins normal, IFE IgG lambda monoclonal protein detected,  hepatitis B-, hepatitis C negative, HIV negative, G6PD normal, CK 139, TSH normal, vitamin D 19, ANA 1: 320NS, ENA (Smith, RNP, Ro, La, double-stranded DNA, SCL 70 -), ESR 38, RF 194, anti-CCP> 250, '14 3 3 ' eta positive  Speciality Comments: No specialty comments available.  Procedures:  No procedures performed Allergies: Patient has no known allergies.   Assessment / Plan:     Visit Diagnoses: Rheumatoid arthritis involving multiple sites with positive rheumatoid factor (HCC) - Inflammatory erosive arthritis with positive rheumatoid factor, positive anti-CCP, +'14 3 3 ' eta, positive ANA, ENA negative, and elevated sed rate.  Detailed counseling regarding rheumatoid arthritis was provided.  Patient treatment options and their side effects were discussed.  As she has an erosive disease she will need more aggressive therapy.  We will start with methotrexate.  Indications side effects contraindications were discussed.  She was very to proceed.  A handout was given and consent was taken.  The plan is to start her on methotrexate 15 mg subcu weekly for 2 weeks and then increase it to 20 mg subcu weekly as tolerated.  She will need lab work every 2 weeks x 2 and then every 2 months to monitor for drug toxicity.  We will also call in folic acid 2 mg p.o. daily.  Drug Counseling TB Gold: July 01, 2019 Hepatitis panel: July 01, 2019  Chest-xray: 2013  Contraception: Not applicable  Alcohol use: None  Patient was counseled on the purpose, proper use, and adverse effects of methotrexate including nausea, infection, and signs and symptoms of pneumonitis.  Reviewed instructions with patient to take methotrexate weekly along with folic acid daily.  Discussed the importance of frequent monitoring of kidney and liver function and blood counts, and provided patient with standing lab instructions.  Counseled patient to avoid NSAIDs and alcohol while on methotrexate.  Provided patient with educational materials on  methotrexate and answered all questions.  Advised patient to get annual influenza vaccine and to get a pneumococcal vaccine if patient has not already had one.  Patient voiced understanding.  Patient consented to methotrexate use.  Will upload into chart.    High risk medication use-patient will be starting subcu methotrexate today.  Polyarthralgia - X-ray of bilateral hands and feet obtained last visit were consistent with erosive rheumatoid arthritis.  X-ray of bilateral hip joints were unremarkable.  She continues to have pain and discomfort in multiple joints as described above.  Arthropathy of lumbar facet joint-she has chronic lumbar discomfort.  Abnormal SPEP - Patient has abnormal IFE.  We will refer her to hematology.  Chronic pain syndrome-she is on pain medications.  Family history of rheumatoid arthritis  Anxiety and depression-patient states her anxiety and depression is getting worse.  Have advised her to contact her PCP.  Essential hypertension-blood pressure was elevated today.  Elevated cholesterol  History of type 2 diabetes mellitus-she is on medications.  History of gastroesophageal reflux (GERD)  Orders: No orders of the defined types were placed in this encounter.  No orders of the defined types were placed in this encounter.   Face-to-face time spent with patient was 40 minutes. Greater than 50% of time was spent in counseling and coordination of care.  Follow-Up Instructions: Return in about 6 weeks (around 09/10/2019) for Rheumatoid arthritis, Osteoarthritis.   Bo Merino, MD  Note - This record has been created using Editor, commissioning.  Chart creation errors have been sought, but may not always  have been located. Such creation errors do not reflect on  the standard of medical care.

## 2019-07-30 ENCOUNTER — Encounter: Payer: Self-pay | Admitting: Rheumatology

## 2019-07-30 ENCOUNTER — Other Ambulatory Visit: Payer: Self-pay

## 2019-07-30 ENCOUNTER — Telehealth: Payer: Self-pay

## 2019-07-30 ENCOUNTER — Ambulatory Visit (INDEPENDENT_AMBULATORY_CARE_PROVIDER_SITE_OTHER): Payer: 59 | Admitting: Rheumatology

## 2019-07-30 VITALS — BP 145/90 | HR 94 | Resp 16 | Ht 64.0 in | Wt 218.4 lb

## 2019-07-30 DIAGNOSIS — F329 Major depressive disorder, single episode, unspecified: Secondary | ICD-10-CM

## 2019-07-30 DIAGNOSIS — E7800 Pure hypercholesterolemia, unspecified: Secondary | ICD-10-CM

## 2019-07-30 DIAGNOSIS — M2550 Pain in unspecified joint: Secondary | ICD-10-CM

## 2019-07-30 DIAGNOSIS — M47816 Spondylosis without myelopathy or radiculopathy, lumbar region: Secondary | ICD-10-CM | POA: Diagnosis not present

## 2019-07-30 DIAGNOSIS — M0579 Rheumatoid arthritis with rheumatoid factor of multiple sites without organ or systems involvement: Secondary | ICD-10-CM | POA: Diagnosis not present

## 2019-07-30 DIAGNOSIS — Z79899 Other long term (current) drug therapy: Secondary | ICD-10-CM

## 2019-07-30 DIAGNOSIS — Z8719 Personal history of other diseases of the digestive system: Secondary | ICD-10-CM

## 2019-07-30 DIAGNOSIS — Z8261 Family history of arthritis: Secondary | ICD-10-CM

## 2019-07-30 DIAGNOSIS — F419 Anxiety disorder, unspecified: Secondary | ICD-10-CM

## 2019-07-30 DIAGNOSIS — I10 Essential (primary) hypertension: Secondary | ICD-10-CM

## 2019-07-30 DIAGNOSIS — Z8639 Personal history of other endocrine, nutritional and metabolic disease: Secondary | ICD-10-CM

## 2019-07-30 DIAGNOSIS — G894 Chronic pain syndrome: Secondary | ICD-10-CM

## 2019-07-30 DIAGNOSIS — R778 Other specified abnormalities of plasma proteins: Secondary | ICD-10-CM

## 2019-07-30 MED ORDER — FOLIC ACID 1 MG PO TABS: 2.0000 mg | ORAL_TABLET | Freq: Every day | ORAL | 2 refills | Status: DC

## 2019-07-30 NOTE — Telephone Encounter (Signed)
Submitted a Prior Authorization request to [facility] for OTREXUP via Cover My Meds. Will update once we receive a response.  Key: [id number] PA Case ID: [id number]  Verlin Fester, PharmD, [place], CPP Clinical Specialty Pharmacist [phone]  07/30/2019 2:36 PM

## 2019-07-30 NOTE — Patient Instructions (Signed)
Standing Labs We placed an order today for your standing lab work.    Please come back and get your standing labs in 2weeks x2 then every 2 months  We have open lab daily Monday through Thursday from 8:30-12:30 PM and 1:30-4:30 PM and Friday from 8:30-12:30 PM and 1:30-4:00 PM at the office of Dr. Bo Merino.   You may experience shorter wait times on Monday and Friday afternoons. The office is located at [address] [place], [place], [place] [address] No appointment is necessary.   Labs are drawn by [facility].  You may receive a bill from [facility] for your lab work.  If you wish to have your labs drawn at another location, please call the office 24 hours in advance to send orders.  If you have any questions regarding directions or hours of operation,  please call [phone].   Just as a reminder please drink plenty of water prior to coming for your lab work. Thanks!   Methotrexate tablets What is this medicine? METHOTREXATE (METH oh TREX ate) is a chemotherapy drug used to treat cancer including breast cancer, leukemia, and lymphoma. This medicine can also be used to treat psoriasis and certain kinds of arthritis. This medicine may be used for other purposes; ask your health care provider or pharmacist if you have questions. COMMON BRAND NAME(S): Rheumatrex, Trexall What should I tell my health care provider before I take this medicine? They need to know if you have any of these conditions:  fluid in the stomach area or lungs  if you often drink alcohol  infection or immune system problems  kidney disease or on hemodialysis  liver disease  low blood counts, like low white cell, platelet, or red cell counts  lung disease  radiation therapy  stomach ulcers  ulcerative colitis  an unusual or allergic reaction to methotrexate, other medicines, foods, dyes, or preservatives  pregnant or trying to get pregnant  breast-feeding How should I use this  medicine? Take this medicine by mouth with a glass of water. Follow the directions on the prescription label. Take your medicine at regular intervals. Do not take it more often than directed. Do not stop taking except on your doctor's advice. Make sure you know why you are taking this medicine and how often you should take it. If this medicine is used for a condition that is not cancer, like arthritis or psoriasis, it should be taken weekly, NOT daily. Taking this medicine more often than directed can cause serious side effects, even death. Talk to your healthcare provider about safe handling and disposal of this medicine. You may need to take special precautions. Talk to your pediatrician regarding the use of this medicine in children. While this drug may be prescribed for selected conditions, precautions do apply. Overdosage: If you think you have taken too much of this medicine contact a poison control center or emergency room at once. NOTE: This medicine is only for you. Do not share this medicine with others. What if I miss a dose? If you miss a dose, talk with your doctor or health care professional. Do not take double or extra doses. What may interact with this medicine? This medicine may interact with the following medication:  acitretin  aspirin and aspirin-like medicines including salicylates  azathioprine  certain antibiotics like penicillins, tetracycline, and chloramphenicol  cyclosporine  gold  hydroxychloroquine  live virus vaccines  NSAIDs, medicines for pain and inflammation, like ibuprofen or naproxen  other cytotoxic agents  penicillamine  phenylbutazone  phenytoin  probenecid  retinoids such as isotretinoin and tretinoin  steroid medicines like prednisone or cortisone  sulfonamides like sulfasalazine and trimethoprim/sulfamethoxazole  theophylline This list may not describe all possible interactions. Give your health care provider a list of all the  medicines, herbs, non-prescription drugs, or dietary supplements you use. Also tell them if you smoke, drink alcohol, or use illegal drugs. Some items may interact with your medicine. What should I watch for while using this medicine? Avoid alcoholic drinks. This medicine can make you more sensitive to the sun. Keep out of the sun. If you cannot avoid being in the sun, wear protective clothing and use sunscreen. Do not use sun lamps or tanning beds/booths. You may need blood work done while you are taking this medicine. Call your doctor or health care professional for advice if you get a fever, chills or sore throat, or other symptoms of a cold or flu. Do not treat yourself. This drug decreases your body's ability to fight infections. Try to avoid being around people who are sick. This medicine may increase your risk to bruise or bleed. Call your doctor or health care professional if you notice any unusual bleeding. Check with your doctor or health care professional if you get an attack of severe diarrhea, nausea and vomiting, or if you sweat a lot. The loss of too much body fluid can make it dangerous for you to take this medicine. Talk to your doctor about your risk of cancer. You may be more at risk for certain types of cancers if you take this medicine. Both men and women must use effective birth control with this medicine. Do not become pregnant while taking this medicine or until at least 1 normal menstrual cycle has occurred after stopping it. Women should inform their doctor if they wish to become pregnant or think they might be pregnant. Men should not father a child while taking this medicine and for 3 months after stopping it. There is a potential for serious side effects to an unborn child. Talk to your health care professional or pharmacist for more information. Do not breast-feed an infant while taking this medicine. What side effects may I notice from receiving this medicine? Side effects  that you should report to your doctor or health care professional as soon as possible:  allergic reactions like skin rash, itching or hives, swelling of the face, lips, or tongue  breathing problems or shortness of breath  diarrhea  dry, nonproductive cough  low blood counts - this medicine may decrease the number of white blood cells, red blood cells and platelets. You may be at increased risk for infections and bleeding.  mouth sores  redness, blistering, peeling or loosening of the skin, including inside the mouth  signs of infection - fever or chills, cough, sore throat, pain or trouble passing urine  signs and symptoms of bleeding such as bloody or black, tarry stools; red or dark-brown urine; spitting up blood or brown material that looks like coffee grounds; red spots on the skin; unusual bruising or bleeding from the eye, gums, or nose  signs and symptoms of kidney injury like trouble passing urine or change in the amount of urine  signs and symptoms of liver injury like dark yellow or brown urine; general ill feeling or flu-like symptoms; light-colored stools; loss of appetite; nausea; right upper belly pain; unusually weak or tired; yellowing of the eyes or skin Side effects that usually do not require medical attention (  report to your doctor or health care professional if they continue or are bothersome):  dizziness  hair loss  tiredness  upset stomach  vomiting This list may not describe all possible side effects. Call your doctor for medical advice about side effects. You may report side effects to FDA at 1-800-FDA-1088. Where should I keep my medicine? Keep out of the reach of children. Store at room temperature between 20 and 25 degrees C (68 and 77 degrees F). Protect from light. Throw away any unused medicine after the expiration date. NOTE: This sheet is a summary. It may not cover all possible information. If you have questions about this medicine, talk to  your doctor, pharmacist, or health care provider.  2020 Elsevier/Gold Standard (2016-10-24 13:38:43)

## 2019-07-30 NOTE — Telephone Encounter (Signed)
Please apply for rasuvo or otrexup, per Dr. Corliss Skains.   Patient was trained on both pens and also vial/syringe. She was provided with two Rasuvo 15mg  samples and will return for labs in 2 weeks. Advised patient if we received approval for auto injector, we would send prescription and if not approved we will send in vial and syringe. Patient is comfortable with either option but would prefer auto injector. Thanks!

## 2019-07-30 NOTE — Progress Notes (Signed)
Medication Samples have been provided to the patient.  Drug name: Rasuvo   Strength: 15mg    Qty: 2 LOT AB Exp.Date: 04/2020  Dosing instructions: Inject 15mg  into the skin once weekly for 2 weeks.   The patient has been instructed regarding the correct time, dose, and frequency of taking this medication, including desired effects and most common side effects.   05/2020 Ilisha Blust 10:04 AM 07/30/2019

## 2019-08-02 ENCOUNTER — Encounter: Payer: Self-pay | Admitting: *Deleted

## 2019-08-02 ENCOUNTER — Telehealth: Payer: Self-pay | Admitting: Rheumatology

## 2019-08-02 MED ORDER — PREDNISONE 5 MG PO TABS: ORAL_TABLET | ORAL | 0 refills | Status: DC

## 2019-08-02 NOTE — Telephone Encounter (Signed)
Ok to send in prednisone taper starting at 10 mg tapering by 2.5 mg every 4 days.

## 2019-08-02 NOTE — Telephone Encounter (Signed)
Spoke with patient and she states she has taken Prednisone in the past and tolerated it well. Patient states she monitors her blood sugars closely and her diabetes is well controlled. Patient advised per Sherron Ales, PA-C we will send prescription to the pharmacy for prednisone. Advised patient to monitor her blood sugars very closely.

## 2019-08-02 NOTE — Telephone Encounter (Signed)
Has she taken prednisone in the past? Please ask if she monitors her blood glucose closely and ask if her diabetes has been well controlled?

## 2019-08-02 NOTE — Telephone Encounter (Signed)
Patient advised her referral is for an abnormal SPEP which test for abnormal proteins in the blood. Patient advised she is seeing the hematologist. Patient asked for me to advise her daughter and gave verbal permission. Advised daughter of this information. Patient states she was recently started on MTX and has discontinued Naproxen due to starting MTX. Patient states she is having pain in her legs and in her lower back. Patient states she had a bad night last night and unable to sleep. Patient requesting something for the discomfort. Please advise.

## 2019-08-02 NOTE — Telephone Encounter (Signed)
Received a fax regarding Prior Authorization from [facility] for OTREXUP. Authorization has been DENIED because no trial and failure of oral methotrexate or documentation of difficulty injecting generic vial and syringe.  Please send prescription for methotrexate vial and syringe pending labs in 2 weeks to see if she is able to increase from 0.71ml to 0.2ml.   Verlin Fester, PharmD, [place], CPP Clinical Specialty Pharmacist [phone]  08/02/2019 8:58 AM

## 2019-08-02 NOTE — Progress Notes (Signed)
Reached out to Trenton Gammon to introduce myself as the office RN Navigator and explain our new patient process. Reviewed the reason for their referral and scheduled their new patient appointment along with labs. Provided address and directions to the office including call back phone number. Reviewed with patient any concerns they may have or any possible barriers to attending their appointment.   Informed patient about my role as a navigator and that I will meet with them prior to their New Patient appointment and more fully discuss what services I can provide. At this time patient has no further questions or needs.

## 2019-08-02 NOTE — Telephone Encounter (Signed)
Patient requesting a call for someone to discuss the referral to the [facility]. They called her to schedule an appointment. Patient wants to discuss this, so her children will not worry. Please call to advise.

## 2019-08-02 NOTE — Telephone Encounter (Signed)
Spoke with patient and advised her that the Otrexup PA was denied by her insurance. Patient advised once she has her labs done after her send dose of Rasuvo 15 mg pen we will send prescription to the pharmacy. Patient verbalized understanding.

## 2019-08-04 ENCOUNTER — Telehealth: Payer: Self-pay | Admitting: Rheumatology

## 2019-08-04 NOTE — Telephone Encounter (Signed)
Please advise patient to come in for knee joint injection.  She cannot take higher dose of prednisone as she is diabetic.

## 2019-08-04 NOTE — Telephone Encounter (Signed)
Patient called stating "her knees are swelling and extremely painful."  Patient states she began taking Rasuvo last Friday, 07/30/19.  Patient states she tried using Biofreeze, but the pain is "unbearable."  Patient is requesting a return call.

## 2019-08-04 NOTE — Telephone Encounter (Signed)
Advised patient to come in for knee joint injection.  She cannot take higher dose of prednisone as she is diabetic. Patient verbalized understanding and is scheduled for 08/09/2019.

## 2019-08-04 NOTE — Telephone Encounter (Signed)
Patient states she is experiencing bilateral knee pain, patient states the left knee is swollen significantly. Patient states her knees are warm to the touch. Patient denies any falls, she has good ROM in both knees. Prednisone was sent in on 08/02/2019 and patient states she has not gotten any relief from the prednisone. Patient has not taken any NSAIDs due to starting methotrexate. Patient has also tried bio-freeze without relief.  Patient would like to know recommendations for the pain she is experiencing. Please advise.

## 2019-08-04 NOTE — Progress Notes (Signed)
Office Visit Note  Patient: Jeanette Potts             Date of Birth: June 26, 1970           MRN: [id number]             PCP: Vassie Moment, MD Referring: Vassie Moment, MD Visit Date: 08/09/2019 Occupation: @GUAROCC @  Subjective:  Pain in multiple joints   History of Present Illness: Katianne Barre is a 49 y.o. female with history of seropositive rheumatoid arthritis.  Patient presents today with pain in multiple joints including both shoulders, both wrists, both knees, and the left ankle.  She has noticed intermittent swelling in both wrists, both knees, and the left ankle joint.  She is taking a prednisone taper as prescribed and is currently on prednisone 5 mg 1.5 tablets by mouth daily.  She has only had 1 injection of methotrexate.  She is taking folic acid 2 mg by mouth daily.  Patient reports that on Friday she was about to inject methotrexate and the pen device malfunctioned and she did not receive any of the medication.   Activities of Daily Living:  Patient reports joint stiffness all day  Patient Reports nocturnal pain.  Difficulty dressing/grooming: Denies Difficulty climbing stairs: Reports Difficulty getting out of chair: Reports Difficulty using hands for taps, buttons, cutlery, and/or writing: Denies  Review of Systems  Constitutional: Positive for fatigue.  HENT: Negative for mouth sores, mouth dryness and nose dryness.   Eyes: Negative for pain, visual disturbance and dryness.  Respiratory: Negative for cough, hemoptysis, shortness of breath and difficulty breathing.   Cardiovascular: Negative for chest pain, palpitations, hypertension and swelling in legs/feet.  Gastrointestinal: Negative for blood in stool, constipation and diarrhea.  Endocrine: Negative for increased urination.  Genitourinary: Negative for painful urination.  Musculoskeletal: Positive for arthralgias, joint pain, joint swelling and morning stiffness. Negative for myalgias, muscle weakness, muscle  tenderness and myalgias.  Skin: Positive for rash. Negative for color change, pallor, hair loss, nodules/bumps, skin tightness, ulcers and sensitivity to sunlight.  Allergic/Immunologic: Negative for susceptible to infections.  Neurological: Negative for dizziness, numbness, headaches and weakness.  Hematological: Negative for swollen glands.  Psychiatric/Behavioral: Negative for depressed mood and sleep disturbance. The patient is not nervous/anxious.     PMFS History:  Patient Active Problem List   Diagnosis Date Noted  . Chronic pain syndrome 07/01/2019  . Anxiety and depression 07/01/2019  . History of gastroesophageal reflux (GERD) 07/01/2019  . History of type 2 diabetes mellitus 07/01/2019  . Essential hypertension 07/01/2019  . Elevated cholesterol 07/01/2019    Past Medical History:  Diagnosis Date  . Diabetes mellitus   . Hypertension   . Rheumatoid arthritis ([place])     Family History  Problem Relation Age of Onset  . Diabetes Mother   . Hypertension Mother   . Rheum arthritis Mother   . Heart attack Father   . Hypertension Brother    History reviewed. No pertinent surgical history. Social History   Social History Narrative  . Not on file    There is no immunization history on file for this patient.   Objective: Vital Signs: BP 114/79 (BP Location: Left Arm, Patient Position: Sitting, Cuff Size: Normal)   Pulse 91   Resp 14   Ht 5\' 4"  (1.626 m)   Wt 215 lb 9.6 oz (97.8 kg)   BMI 37.01 kg/m    Physical Exam Vitals and nursing note reviewed.  Constitutional:  Appearance: She is well-developed.  HENT:     Head: Normocephalic and atraumatic.  Eyes:     Conjunctiva/sclera: Conjunctivae normal.  Pulmonary:     Effort: Pulmonary effort is normal.  Abdominal:     General: Bowel sounds are normal.     Palpations: Abdomen is soft.  Musculoskeletal:     Cervical back: Normal range of motion.  Lymphadenopathy:     Cervical: No cervical adenopathy.    Skin:    General: Skin is warm and dry.     Capillary Refill: Capillary refill takes less than 2 seconds.  Neurological:     Mental Status: She is alert and oriented to person, place, and time.  Psychiatric:        Behavior: Behavior normal.      Musculoskeletal Exam: C-spine has good range of motion with discomfort.  Thoracic and lumbar spine good range of motion.  Tenderness over the right SI joint.  Shoulder joints have painful but full range of motion bilaterally.  Elbow joints have good range of motion with no tenderness or inflammation.  She has tenderness and inflammation of both wrist joints.  She has tenderness and inflammation of several MCP joints as described below.  Hip joints have good range of motion.  Knee joints have good range of motion with discomfort bilaterally.  No warmth or effusion of knee joints noted.  She has tenderness inflammation of the left ankle joint.  CDAI Exam: CDAI Score: 19.6  Patient Global: 8 mm; Provider Global: 8 mm Swollen: 7 ; Tender: 14  Joint Exam 08/09/2019      Right  Left  Glenohumeral   Tender   Tender  Wrist  Swollen Tender  Swollen Tender  MCP 2     Swollen Tender  MCP 3     Swollen Tender  MCP 4  Swollen Tender   Tender  MCP 5  Swollen Tender   Tender  Sacroiliac   Tender     Knee   Tender   Tender  Ankle     Swollen Tender     Investigation: No additional findings.  Imaging: XR KNEE 3 VIEW LEFT  Result Date: 08/09/2019 The medial lateral compartment narrowing was noted.  Mild patellofemoral narrowing was noted.  No chondrocalcinosis was noted. 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  XR KNEE 3 VIEW RIGHT  Result Date: 08/09/2019 No medial lateral compartment narrowing was noted.  Intercondylar osteophyte was noted.  Moderate patellofemoral narrowing was noted. 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   Recent Labs: Lab  Results  Component Value Date   WBC 10.3 07/01/2019   HGB 9.5 (L) 07/01/2019   PLT 474 (H) 07/01/2019   NA 141 07/01/2019   K 4.1 07/01/2019   CL 106 07/01/2019   CO2 27 07/01/2019   GLUCOSE 102 (H) 07/01/2019   BUN 7 07/01/2019   CREATININE 0.64 07/01/2019   BILITOT 0.4 07/01/2019   ALKPHOS 61 07/01/2011   AST 12 07/01/2019   ALT 8 07/01/2019   PROT 7.5 07/01/2019   PROT 7.6 07/01/2019   ALBUMIN 3.8 07/01/2011   CALCIUM 9.4 07/01/2019   GFRAA 121 07/01/2019   QFTBGOLDPLUS NEGATIVE 07/01/2019    Speciality Comments: No specialty comments available.  Procedures:  Large Joint Inj: L knee on 08/09/2019 10:04 AM Indications: pain Details: 27 G 1.5 in needle, medial approach  Arthrogram: No  Medications: 1.5 mL lidocaine 1 %; 40  mg triamcinolone acetonide 40 MG/ML Aspirate: 0 mL Outcome: tolerated well, no immediate complications Procedure, treatment alternatives, risks and benefits explained, specific risks discussed. Consent was given by the patient. Immediately prior to procedure a time out was called to verify the correct patient, procedure, equipment, support staff and site/side marked as required. Patient was prepped and draped in the usual sterile fashion.     Allergies: Patient has no known allergies.   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  She is currently having pain in both shoulder joints, both wrist joints, both hands, both knees, and the left ankle joint.  She has tenderness and synovitis of several MCP joints and wrist joints as described above.  She has been experiencing severe discomfort in both knee joints and has had difficulty with ambulation and performing her  ADLs due to the discomfort.  She has good range of motion of both knees with discomfort bilaterally.  No warmth or effusion of knee joints noted on exam.  X-rays of both knee joints were obtained today. She requested a left knee joint cortisone injection today. She has had 1 dose of Rasuvo 15 mg sq injections once weekly and was due for her second dose on Friday but the device malfunctioned and she did not receive any of the medication.  She was provided a sample of rasuvo 15 mg.  She will return for labs in 1 week then 2 weeks after increasing to rasuvo 20 mg. She is currently taking a prednisone taper as prescribed and is on 7.5 mg daily.  She is on low-dose prednisone due to being a diabetic.  She was advised to continue to monitor blood glucose closely.  She was prescribed oxycodone for acute management of pain by her PCP but continues to be in significant discomfort.  We discussed adding on Enbrel to the current regimen.  Indications, contraindications, and potential side effects of Enbrel were discussed.  All questions were addressed and consent was obtained.  We will apply for Enbrel through her insurance.  She will administer the first injection in the office and continue on enbrel 50 mg sq injections once weekly. She will follow up in 6 weeks.   Medication counseling:   TB Test: negative on 07/01/19 Hepatitis panel: Negative on 07/01/19  HIV: negative on 07/01/19  SPEP: abnormal IFE, referred to hematology  Immunoglobulins: WNL on 07/01/19  Chest x-ray: No active disease on 07/01/11   Does patient have diagnosis of heart failure?  No  Counseled patient that Enbrel is a TNF blocking agent.  Reviewed Enbrel dose of 50 mg once weekly.  Counseled patient on purpose, proper use, and adverse effects of Enbrel.  Reviewed the most common adverse effects including infections, headache, and injection site reactions. Discussed that there is the possibility of an increased risk of malignancy but it is not well  understood if this increased risk is due to the medication or the disease state.  Advised patient to get yearly dermatology exams due to risk of skin cancer.  Reviewed the importance of regular labs while on Enbrel therapy.  Advised patient to get standing labs one month after starting Enbrel then every 2 months.  Provided patient with standing lab orders.  Counseled patient that  Enbrel should be held prior to scheduled surgery.  Counseled patient to avoid live vaccines while on Enbrel.  Advised patient to get annual influenza vaccine and the pneumococcal vaccine as needed.  Provided patient with medication education material and answered all questions.  Patient voiced understanding.  Patient consented to Enbrel.  Will upload consent into the media tab.  Reviewed storage instructions for Enbrel.  Advised initial injection must be administered in office.  Patient voiced understanding.    High risk medication use - Prescribed Rasuvo 15 mg sq injections weekly for 2 weeks then advised to increase to 20 mg sq injections if labs are stable.  She has only injected 1 dose of Rasuvo 15 mg due to malfunction of the 2nd pen.  She was provided a sample today.  She will return for lab work in 1 week then 2 weeks, 2 months, then every 3 months.  Standing orders for CBC and CMP will be placed today.   Arthropathy of lumbar facet joint: She is not having any discomfort in her lower back at this time.  No symptoms of radiculopathy.   Chronic pain of both knees - She presents today with severe pain in both knee joints.  She has painful ROM of both knees.  She has had difficulty ambulating and performing ADLs due to the discomfort. No warmth or effusion was noted on exam.  She is not experiencing any mechanical symptoms at this time. X-rays of both knee joints were obtained today.  Plan: XR KNEE 3 VIEW LEFT, XR KNEE 3 VIEW RIGHT  Abnormal SPEP: Referred to Dr. Myna Hidalgo.   Chronic pain syndrome: She was prescribed Oxycodone 5  mg for acute management of pain by her PCP.    Other medical conditions are listed as follows:   Family history of rheumatoid arthritis  Anxiety and depression  Essential hypertension  Elevated cholesterol  History of type 2 diabetes mellitus  History of gastroesophageal reflux (GERD)  Vitamin D deficiency    Orders: Orders Placed This Encounter  Procedures  . XR KNEE 3 VIEW LEFT  . XR KNEE 3 VIEW RIGHT  . CBC with Differential/Platelet  . COMPLETE METABOLIC PANEL WITH GFR   No orders of the defined types were placed in this encounter.   Face-to-face time spent with patient was 30 minutes. Greater than 50% of time was spent in counseling and coordination of care.  Follow-Up Instructions: Return in about 6 weeks (around 09/20/2019) for Rheumatoid arthritis.   Gearldine Bienenstock, PA-C  Note - This record has been created using Dragon software.  Chart creation errors have been sought, but may not always  have been located. Such creation errors do not reflect on  the standard of medical care.

## 2019-08-05 ENCOUNTER — Ambulatory Visit: Payer: 59 | Admitting: Rheumatology

## 2019-08-06 ENCOUNTER — Telehealth: Payer: Self-pay | Admitting: Rheumatology

## 2019-08-06 NOTE — Telephone Encounter (Signed)
Channel from [facility] called stating she had a telehealth visit with patient on Tuesday, 08/03/19.  Channel states patient was "visibly in a lot of pain and having difficulty moving."  Channel states patient said she was not to take NSAIDS while on Methotrexate per Dr. Corliss Skains.  Channel states she did call in a "small prescription of Oxycodone 5 mg to help her with the pain until her next appointment with Dr. Corliss Skains."  Channel states she can be reached at [phone] or cell #[phone] to coordinate care.

## 2019-08-09 ENCOUNTER — Ambulatory Visit (INDEPENDENT_AMBULATORY_CARE_PROVIDER_SITE_OTHER): Payer: 59 | Admitting: Physician Assistant

## 2019-08-09 ENCOUNTER — Ambulatory Visit: Payer: Self-pay

## 2019-08-09 ENCOUNTER — Other Ambulatory Visit: Payer: Self-pay

## 2019-08-09 ENCOUNTER — Telehealth: Payer: Self-pay

## 2019-08-09 ENCOUNTER — Ambulatory Visit: Payer: Medicaid Other | Admitting: Rheumatology

## 2019-08-09 ENCOUNTER — Telehealth: Payer: Self-pay | Admitting: Pharmacist

## 2019-08-09 ENCOUNTER — Encounter: Payer: Self-pay | Admitting: Physician Assistant

## 2019-08-09 VITALS — BP 114/79 | HR 91 | Resp 14 | Ht 64.0 in | Wt 215.6 lb

## 2019-08-09 DIAGNOSIS — Z79899 Other long term (current) drug therapy: Secondary | ICD-10-CM

## 2019-08-09 DIAGNOSIS — M47816 Spondylosis without myelopathy or radiculopathy, lumbar region: Secondary | ICD-10-CM | POA: Diagnosis not present

## 2019-08-09 DIAGNOSIS — I10 Essential (primary) hypertension: Secondary | ICD-10-CM

## 2019-08-09 DIAGNOSIS — Z8719 Personal history of other diseases of the digestive system: Secondary | ICD-10-CM

## 2019-08-09 DIAGNOSIS — M25562 Pain in left knee: Secondary | ICD-10-CM

## 2019-08-09 DIAGNOSIS — M0579 Rheumatoid arthritis with rheumatoid factor of multiple sites without organ or systems involvement: Secondary | ICD-10-CM

## 2019-08-09 DIAGNOSIS — E7800 Pure hypercholesterolemia, unspecified: Secondary | ICD-10-CM

## 2019-08-09 DIAGNOSIS — E559 Vitamin D deficiency, unspecified: Secondary | ICD-10-CM

## 2019-08-09 DIAGNOSIS — M25561 Pain in right knee: Secondary | ICD-10-CM | POA: Diagnosis not present

## 2019-08-09 DIAGNOSIS — G8929 Other chronic pain: Secondary | ICD-10-CM | POA: Diagnosis not present

## 2019-08-09 DIAGNOSIS — R778 Other specified abnormalities of plasma proteins: Secondary | ICD-10-CM | POA: Diagnosis not present

## 2019-08-09 DIAGNOSIS — F329 Major depressive disorder, single episode, unspecified: Secondary | ICD-10-CM

## 2019-08-09 DIAGNOSIS — G894 Chronic pain syndrome: Secondary | ICD-10-CM

## 2019-08-09 DIAGNOSIS — Z8639 Personal history of other endocrine, nutritional and metabolic disease: Secondary | ICD-10-CM

## 2019-08-09 DIAGNOSIS — F32A Depression, unspecified: Secondary | ICD-10-CM

## 2019-08-09 DIAGNOSIS — F419 Anxiety disorder, unspecified: Secondary | ICD-10-CM

## 2019-08-09 DIAGNOSIS — Z8261 Family history of arthritis: Secondary | ICD-10-CM

## 2019-08-09 MED ORDER — RASUVO 15 MG/0.3ML ~~LOC~~ SOAJ: 15.0000 mg | SUBCUTANEOUS | 0 refills | Status: DC

## 2019-08-09 NOTE — Patient Instructions (Signed)
Standing Labs We placed an order today for your standing lab work.    Please come back and get your standing labs in 1 week then in 2 weeks   We have open lab daily Monday through Thursday from 8:30-12:30 PM and 1:30-4:30 PM and Friday from 8:30-12:30 PM and 1:30-4:00 PM at the office of Dr. Pollyann Savoy.   You may experience shorter wait times on Monday and Friday afternoons. The office is located at [address], [place], [place] [address] No appointment is necessary.   Labs are drawn by [facility].  You may receive a bill from [facility] for your lab work.  If you wish to have your labs drawn at another location, please call the office 24 hours in advance to send orders.  If you have any questions regarding directions or hours of operation,  please call [phone].   Just as a reminder please drink plenty of water prior to coming for your lab work. Thanks!

## 2019-08-09 NOTE — Progress Notes (Addendum)
Pharmacy Note  Subjective: Patient presents today to the [facility] for follow up office visit.  Patient seen by the pharmacist for counseling on Enbrel for rheumatoid arthritis.  Objective:  CBC    Component Value Date/Time   WBC 10.3 07/01/2019 0920   RBC 5.29 (H) 07/01/2019 0920   HGB 9.5 (L) 07/01/2019 0920   HCT 33.1 (L) 07/01/2019 0920   PLT 474 (H) 07/01/2019 0920   MCV 62.6 (L) 07/01/2019 0920   MCH 18.0 (L) 07/01/2019 0920   MCHC 28.7 (L) 07/01/2019 0920   RDW 21.6 (H) 07/01/2019 0920   LYMPHSABS 2,009 07/01/2019 0920   MONOABS 0.3 09/14/2014 1640   EOSABS 52 07/01/2019 0920   BASOSABS 41 07/01/2019 0920     CMP     Component Value Date/Time   NA 141 07/01/2019 0920   K 4.1 07/01/2019 0920   CL 106 07/01/2019 0920   CO2 27 07/01/2019 0920   GLUCOSE 102 (H) 07/01/2019 0920   BUN 7 07/01/2019 0920   CREATININE 0.64 07/01/2019 0920   CALCIUM 9.4 07/01/2019 0920   PROT 7.5 07/01/2019 0920   PROT 7.6 07/01/2019 0920   ALBUMIN 3.8 07/01/2011 1832   AST 12 07/01/2019 0920   ALT 8 07/01/2019 0920   ALKPHOS 61 07/01/2011 1832   BILITOT 0.4 07/01/2019 0920   GFRNONAA 104 07/01/2019 0920   GFRAA 121 07/01/2019 0920     Baseline Immunosuppressant Therapy Labs TB GOLD Quantiferon TB Gold Latest Ref Rng & Units 07/01/2019  Quantiferon TB Gold Plus NEGATIVE NEGATIVE   Hepatitis Panel Hepatitis Latest Ref Rng & Units 07/01/2019  Hep B Surface Ag NON-REACTI NON-REACTIVE  Hep B IgM NON-REACTI NON-REACTIVE  Hep C Ab NON-REACTI NON-REACTIVE  Hep C Ab NON-REACTI NON-REACTIVE   HIV Lab Results  Component Value Date   HIV NON-REACTIVE 07/01/2019   Immunoglobulins Immunoglobulin Electrophoresis Latest Ref Rng & Units 07/01/2019  IgA  47 - 310 mg/dL 885  IgG 027 - 7,412 mg/dL 8,786  IgM 50 - 767 mg/dL 209   SPEP Serum Protein Electrophoresis Latest Ref Rng & Units 07/01/2019  Total Protein 6.1 - 8.1 g/dL 7.6  Albumin 3.8 - 4.8 g/dL 3.9  Alpha-1 0.2  - 0.3 g/dL 0.3  Alpha-2 0.5 - 0.9 g/dL 0.9  Beta Globulin 0.4 - 0.6 g/dL 0.5  Beta 2 0.2 - 0.5 g/dL 0.4  Gamma Globulin 0.8 - 1.7 g/dL 1.6   O7SJ Lab Results  Component Value Date   G6PDH 28.9 (H) 07/01/2019   TPMT No results found for: TPMT   Chest x-ray: July 01, 2011  Does patient have diagnosis of heart failure?  No  Assessment/Plan:  Counseled patient that Enbrel is a TNF blocking agent. Counseled patient on purpose, proper use, and adverse effects of Enbrel.  Reviewed the most common adverse effects including infections, headache, and injection site reactions. Discussed that there is the possibility of an increased risk of malignancy but it is not well understood if this increased risk is due to the medication or the disease state.  Advised patient to get yearly dermatology exams due to risk of skin cancer.  Counseled patient that Enbrel should be held prior to scheduled surgery.  Counseled patient to avoid live vaccines while on Enbrel. Recommend annual influenza, Pneumovax 23, Prevnar 13, and Shingrix as indicated.   Reviewed the importance of regular labs while on Enbrel therapy.  Advised patient to get standing labs one month after starting Enbrel then every 2 months.  Provided patient with standing lab orders.  Provided patient with medication education material and answered all questions.  Patient voiced understanding.  Patient consented to Enbrel.  Will upload consent into the media tab.  Reviewed storage instructions for Enbrel.  Advised initial injection must be administered in office.  Patient voiced understanding.    Patient dose will be Enbrel 50 mg every 7 days.  Prescription pending labs and/or insurance.  Patient was started on methotrexate at last visit and was given samples of MTX pen.  Insurance did not approve pen device so she will be started on vial and syringe.  Prescirption pending next labs to determine if we can increase her dose. Educated patient on how to  use a vial and syringe and reviewed injection technique with patient.  Patient was able to demonstrate proper technique for injections using vial and syringe.  Provided patient educational material regarding injection technique and storage of methotrexate.    All questions encouraged and answered.  Instructed patient to call with any further questions or concerns.  Mariella Saa, PharmD, [place], CPP Clinical Specialty Pharmacist (Rheumatology and Pulmonology)  08/09/2019 1:51 PM

## 2019-08-09 NOTE — Telephone Encounter (Signed)
Provided patient with 1 15mg  Rasuvo sample. She will return for labs in 1 week. At that point, plan is to increase dose. (patient will be on vial and syringe, prescription will need to be sent).

## 2019-08-09 NOTE — Telephone Encounter (Signed)
Please start benefits investigation for Enbrel for the treatment of rheumatoid arthritis.  Patient was started on injectable methotrexate but will need more aggressive therapy based on severity of symptoms.   Verlin Fester, PharmD, [place], CPP Clinical Specialty Pharmacist (Rheumatology and Pulmonology)  08/09/2019 1:52 PM

## 2019-08-09 NOTE — Telephone Encounter (Signed)
Submitted a Prior Authorization request to [facility] for ENBREL via Cover My Meds. Will update once we receive a response.  (KeyCherylann Banas) - [id number]

## 2019-08-09 NOTE — Progress Notes (Signed)
Medication Samples have been provided to the patient.  Drug name:  Rasuvo  Strength: 15mg      Qty: 1 LOT AB Exp.Date: 04/2020  Dosing instructions: inject 15mg  into the skin once weekly.   The patient has been instructed regarding the correct time, dose, and frequency of taking this medication, including desired effects and most common side effects.   Ivionna Verley C Reginald Mangels 10:20 AM 08/09/2019

## 2019-08-09 NOTE — Addendum Note (Signed)
Addended by: Verlin Fester C on: 08/09/2019 01:55 PM   Modules accepted: Orders

## 2019-08-10 ENCOUNTER — Telehealth: Payer: Self-pay

## 2019-08-10 NOTE — Telephone Encounter (Signed)
Received notification from [facility] regarding a prior authorization for ENBREL. Authorization has been APPROVED from 08/09/2019 to 11/07/2019.    Verlin Fester, PharmD, [place], CPP Clinical Specialty Pharmacist (Rheumatology and Pulmonology)  08/10/2019 8:17 AM

## 2019-08-10 NOTE — Telephone Encounter (Signed)
Patient has Nurse, learning disability and must fill through [employer].

## 2019-08-10 NOTE — Telephone Encounter (Signed)
Patient started on methotrexate and due for labs next week.  New start visit pending next weeks labs.  Verlin Fester, PharmD, [place], CPP Clinical Specialty Pharmacist (Rheumatology and Pulmonology)  08/10/2019 11:39 AM

## 2019-08-10 NOTE — Telephone Encounter (Signed)
Attempted to contact patient and left message on machine to advise patient to call the office in regards to the disability paperwork question she had yesterday.   Patient advised Jeanette Potts that a paper was missing from her disability paperwork so I was calling to clarify. I do not see where we have filled out any disability paperwork.

## 2019-08-11 ENCOUNTER — Encounter: Payer: Self-pay | Admitting: *Deleted

## 2019-08-11 ENCOUNTER — Inpatient Hospital Stay: Payer: 59

## 2019-08-11 ENCOUNTER — Inpatient Hospital Stay: Payer: 59 | Admitting: Hematology & Oncology

## 2019-08-11 NOTE — Telephone Encounter (Signed)
Patient returned call to the office stating that the disability has not received paperwork from our office. Patient advised we do not see where we have disability paperwork we have filled out. Patient states she has been out of work for 3 months. Patient advised that our office has not discussed or filled out disability paperwork. Patient will need to discuss with PCP.

## 2019-08-11 NOTE — Progress Notes (Signed)
Patient a no-show to today's appointment. Called and spoke to patient. She states she forgot the appointment was today due to a death in the family. She is interested in rescheduling.   Message sent to schedulers to reschedule patient.

## 2019-08-17 ENCOUNTER — Other Ambulatory Visit: Payer: Self-pay

## 2019-08-17 DIAGNOSIS — Z79899 Other long term (current) drug therapy: Secondary | ICD-10-CM

## 2019-08-17 DIAGNOSIS — E559 Vitamin D deficiency, unspecified: Secondary | ICD-10-CM

## 2019-08-17 LAB — COMPLETE METABOLIC PANEL WITH GFR
AG Ratio: 1.1 (calc) (ref 1.0–2.5)
ALT: 16 U/L (ref 6–29)
AST: 12 U/L (ref 10–35)
Albumin: 3.6 g/dL (ref 3.6–5.1)
Alkaline phosphatase (APISO): 72 U/L (ref 37–153)
BUN: 10 mg/dL (ref 7–25)
CO2: 26 mmol/L (ref 20–32)
Calcium: 9.2 mg/dL (ref 8.6–10.4)
Chloride: 104 mmol/L (ref 98–110)
Creat: 0.77 mg/dL (ref 0.50–1.05)
GFR, Est African American: 104 mL/min/{1.73_m2} (ref >=60)
GFR, Est Non African American: 90 mL/min/{1.73_m2} (ref >=60)
Globulin: 3.3 g/dL (calc) (ref 1.9–3.7)
Glucose, Bld: 117 mg/dL — ABNORMAL HIGH (ref 65–99)
Potassium: 4.5 mmol/L (ref 3.5–5.3)
Sodium: 138 mmol/L (ref 135–146)
Total Bilirubin: 0.3 mg/dL (ref 0.2–1.2)
Total Protein: 6.9 g/dL (ref 6.1–8.1)

## 2019-08-17 LAB — CBC WITH DIFFERENTIAL/PLATELET
Absolute Monocytes: 552 cells/uL (ref 200–950)
Basophils Absolute: 37 cells/uL (ref 0–200)
Basophils Relative: 0.4 %
Eosinophils Absolute: 138 cells/uL (ref 15–500)
Eosinophils Relative: 1.5 %
HCT: 35.4 % (ref 35.0–45.0)
Hemoglobin: 10.8 g/dL — ABNORMAL LOW (ref 11.7–15.5)
Lymphs Abs: 2889 cells/uL (ref 850–3900)
MCH: 20.5 pg — ABNORMAL LOW (ref 27.0–33.0)
MCHC: 30.5 g/dL — ABNORMAL LOW (ref 32.0–36.0)
MCV: 67.2 fL — ABNORMAL LOW (ref 80.0–100.0)
MPV: 9.6 fL (ref 7.5–12.5)
Monocytes Relative: 6 %
Neutro Abs: 5584 cells/uL (ref 1500–7800)
Neutrophils Relative %: 60.7 %
Platelets: 446 10*3/uL — ABNORMAL HIGH (ref 140–400)
RBC: 5.27 10*6/uL — ABNORMAL HIGH (ref 3.80–5.10)
RDW: 25 % — ABNORMAL HIGH (ref 11.0–15.0)
Total Lymphocyte: 31.4 %
WBC: 9.2 10*3/uL (ref 3.8–10.8)

## 2019-08-17 LAB — VITAMIN D 25 HYDROXY (VIT D DEFICIENCY, FRACTURES): Vit D, 25-Hydroxy: 28 ng/mL — ABNORMAL LOW (ref 30–100)

## 2019-08-18 MED ORDER — "TUBERCULIN SYRINGE 27G X 1/2"" 1 ML MISC": 12.0000 | 3 refills | Status: AC

## 2019-08-18 MED ORDER — METHOTREXATE SODIUM CHEMO INJECTION 50 MG/2ML
20.0000 mg | INTRAMUSCULAR | 0 refills | Status: DC
Start: 2019-08-18 — End: 2019-09-22

## 2019-08-18 NOTE — Progress Notes (Signed)
Glucose is 117.  Rest of CMP WNL.  Hgb low but improving.  Plt count elevated but stable likely due to chronic inflammation.

## 2019-08-18 NOTE — Telephone Encounter (Signed)
Labs resulted and prescription sent to the pharmacy. 

## 2019-08-18 NOTE — Telephone Encounter (Signed)
CMP and CBC stable.  Patient scheduled new start on 6/10 at [facility].   Verlin Fester, PharmD, BCACP, CPP Clinical Specialty Pharmacist (Rheumatology and Pulmonology)  08/18/2019 8:13 AM

## 2019-08-18 NOTE — Progress Notes (Signed)
Pharmacy Note  Subjective:   Patient presents to clinic today to receive first dose of Enbrel.  Patient running a fever or have signs/symptoms of infection? No  Patient currently on antibiotics for the treatment of infection? No  Patient have any upcoming invasive procedures/surgeries? No  Objective: CMP     Component Value Date/Time   NA 138 08/17/2019 1154   K 4.5 08/17/2019 1154   CL 104 08/17/2019 1154   CO2 26 08/17/2019 1154   GLUCOSE 117 (H) 08/17/2019 1154   BUN 10 08/17/2019 1154   CREATININE 0.77 08/17/2019 1154   CALCIUM 9.2 08/17/2019 1154   PROT 6.9 08/17/2019 1154   ALBUMIN 3.8 07/01/2011 1832   AST 12 08/17/2019 1154   ALT 16 08/17/2019 1154   ALKPHOS 61 07/01/2011 1832   BILITOT 0.3 08/17/2019 1154   GFRNONAA 90 08/17/2019 1154   GFRAA 104 08/17/2019 1154    CBC    Component Value Date/Time   WBC 9.2 08/17/2019 1154   RBC 5.27 (H) 08/17/2019 1154   HGB 10.8 (L) 08/17/2019 1154   HCT 35.4 08/17/2019 1154   PLT 446 (H) 08/17/2019 1154   MCV 67.2 (L) 08/17/2019 1154   MCH 20.5 (L) 08/17/2019 1154   MCHC 30.5 (L) 08/17/2019 1154   RDW 25.0 (H) 08/17/2019 1154   LYMPHSABS 2,889 08/17/2019 1154   MONOABS 0.3 09/14/2014 1640   EOSABS 138 08/17/2019 1154   BASOSABS 37 08/17/2019 1154    Baseline Immunosuppressant Therapy Labs TB GOLD Quantiferon TB Gold Latest Ref Rng & Units 07/01/2019  Quantiferon TB Gold Plus NEGATIVE NEGATIVE   Hepatitis Panel Hepatitis Latest Ref Rng & Units 07/01/2019  Hep B Surface Ag NON-REACTI NON-REACTIVE  Hep B IgM NON-REACTI NON-REACTIVE  Hep C Ab NON-REACTI NON-REACTIVE  Hep C Ab NON-REACTI NON-REACTIVE   HIV Lab Results  Component Value Date   HIV NON-REACTIVE 07/01/2019   Immunoglobulins Immunoglobulin Electrophoresis Latest Ref Rng & Units 07/01/2019  IgA  47 - 310 mg/dL 364  IgG 680 - 3,212 mg/dL 2,482  IgM 50 - 500 mg/dL 370   SPEP Serum Protein Electrophoresis Latest Ref Rng & Units 08/17/2019  Total  Protein 6.1 - 8.1 g/dL 6.9  Albumin 3.8 - 4.8 g/dL -  Alpha-1 0.2 - 0.3 g/dL -  Alpha-2 0.5 - 0.9 g/dL -  Beta Globulin 0.4 - 0.6 g/dL -  Beta 2 0.2 - 0.5 g/dL -  Gamma Globulin 0.8 - 1.7 g/dL -   W8GQ Lab Results  Component Value Date   G6PDH 28.9 (H) 07/01/2019   TPMT No results found for: TPMT   Chest x-ray: no acute disease 07/01/2011  Assessment/Plan:  Demonstrated proper injection technique with Enbrel Mini demo device. Patient able to demonstrate proper injection technique using the teach back method.  Patient self injected in the right upper thigh with:  Sample Medication: Enbrel Mini NDC: 91694-503-88 Lot: [id number] Expiration: 10/21  Patient tolerated well.  Observed for 30 mins in office for adverse reaction and none noted.   Patient is to return in 1 month for follow up and labs. She has a follow up appointment scheduled for 09/22/19. Standing orders placed. Prescription sent to [facility] pharmacy required per insurance. Patient given brochure with information on how to sign up for co-pay card.  All questions encouraged and answered.  Instructed patient to call with any further questions or concerns.  Verlin Fester, PharmD, [facility] Rheumatology Clinical Pharmacist  08/18/2019 11:21 AM

## 2019-08-23 ENCOUNTER — Telehealth: Payer: Self-pay | Admitting: Rheumatology

## 2019-08-23 NOTE — Telephone Encounter (Signed)
Per Sherron Ales, PA-C patient should call her ophthalmologist to schedule an appointment for further evaluation.

## 2019-08-23 NOTE — Telephone Encounter (Signed)
Patient is having a lot of lt eye pain. Hot compresses are not helping. Please call to advise.

## 2019-08-23 NOTE — Telephone Encounter (Signed)
Patient advised per Sherron Ales, PA-C , she should call her ophthalmologist to schedule an appointment for further evaluation.

## 2019-08-26 ENCOUNTER — Other Ambulatory Visit: Payer: Self-pay

## 2019-08-26 ENCOUNTER — Ambulatory Visit (INDEPENDENT_AMBULATORY_CARE_PROVIDER_SITE_OTHER): Payer: 59 | Admitting: Pharmacist

## 2019-08-26 VITALS — BP 152/98

## 2019-08-26 DIAGNOSIS — Z7189 Other specified counseling: Secondary | ICD-10-CM | POA: Diagnosis not present

## 2019-08-26 DIAGNOSIS — M0579 Rheumatoid arthritis with rheumatoid factor of multiple sites without organ or systems involvement: Secondary | ICD-10-CM

## 2019-08-26 MED ORDER — ENBREL MINI 50 MG/ML ~~LOC~~ SOCT: 50.0000 mg | SUBCUTANEOUS | 0 refills | Status: DC

## 2019-08-26 NOTE — Patient Instructions (Addendum)
   CALL [facility] to set up first shipment if they have not called you by Friday.  Remember the 5 C's:  COUNTER- leave on the counter at least 30 mins but up to overnight to bring medication to room temperature and prevent stinging  COLD- Placing something cold (like and ice gel pack or cold water bottle) on the injection site just before cleansing with alcohol may help reduce pain  CLARITIN- for the first two weeks of treatment or  the day of, the day before, and the day after injecting to minimize injection site reactions  CORTISONE CREAM- apply if injection site is irritated and itching  CALL ME- if injection site reaction is bigger than the size of your fist, looks infected, blisters, or develop hives   Standing Labs We placed an order today for your standing lab work.    Please come back and get your standing labs in 2 weeks, 4 weeks then every 3 months.  We have open lab daily Monday through Thursday from 8:30-12:30 PM and 1:30-4:30 PM and Friday from 8:30-12:30 PM and 1:30-4:00 PM at the office of Dr. Pollyann Savoy.   You may experience shorter wait times on Monday and Friday afternoons. The office is located at [address], [address], [place], [place] [address] No appointment is necessary.   Labs are drawn by [employer] [facility] [employer].  You may receive a bill from [facility] for your lab work.  If you wish to have your labs drawn at another location, please call the office 24 hours in advance to send orders.  If you have any questions regarding directions or hours of operation,  please call [phone].   Just as a reminder please drink plenty of water prior to coming for your lab work. Thanks!

## 2019-08-30 ENCOUNTER — Inpatient Hospital Stay: Payer: 59 | Attending: Hematology & Oncology

## 2019-08-30 ENCOUNTER — Inpatient Hospital Stay: Payer: 59 | Admitting: Hematology & Oncology

## 2019-08-31 ENCOUNTER — Telehealth: Payer: Self-pay | Admitting: Rheumatology

## 2019-08-31 ENCOUNTER — Encounter: Payer: Self-pay | Admitting: *Deleted

## 2019-08-31 NOTE — Progress Notes (Signed)
Patient was a no-show to her new patient appointment yesterday. This is her second no-show.  Called patient and at this time she doesn't want to be seen by this office. She declined rescheduling her appointment.  Geroge Baseman CMA, from the referring office,  notified of patient's decision not to be seen. Will close referral and the referring office can re-refer if patient desires to be seen.

## 2019-08-31 NOTE — Telephone Encounter (Signed)
Contacted the [facility] and advised them patient has no known allergies.

## 2019-08-31 NOTE — Telephone Encounter (Signed)
[facility] left a voicemail stating they are trying to process a prescription for the patient and need a list of patient's allergies.  Please call #[phone] or fax the information to #[phone] so we can process her prescription for Enbrel.

## 2019-09-01 ENCOUNTER — Telehealth: Payer: Self-pay | Admitting: Rheumatology

## 2019-09-01 NOTE — Telephone Encounter (Signed)
Patient advised we spoke with pharmacy on 08/31/2019 to verify her allergies and that the pharmacy should be in touch to schedule shipment. Patient provided with number to the pharmacy so if she has not heard from the pharmacy she may contact them to set up shipment.

## 2019-09-01 NOTE — Telephone Encounter (Signed)
Patient called to confirm that [facility] will call contact her for delivery instruction for prescription of Enbrel.

## 2019-09-14 NOTE — Progress Notes (Signed)
Office Visit Note  Patient: Jeanette Potts             Date of Birth: 1970-09-06           MRN: [id number]             PCP: Laruth Bouchard, MD Referring: Laruth Bouchard, MD Visit Date: 09/22/2019 Occupation: @GUAROCC @  Subjective:  Pain in multiple joints   History of Present Illness: Westyn Keatley is a 49 y.o. female with history of seropositive rheumatoid arthritis.  She is on Rasuvo 20 mg sq injections weekly, folic acid 2 mg po daily, and Enbrel 50 mg sq injections once weekly.  She started on Enbrel on 08/26/19.  She is due for her fourth Enbrel injection tomorrow.  She has been injecting Rasuvo and Enbrel every Thursday and states that by the following Tuesday she is experiencing significant pain and inflammation.  She has not noticed much improvement on the combination therapy yet.  She has completed the prednisone taper and states that her joint swelling has returned.  She has not been taking Tylenol or NSAIDs recently and feels that has been contributing to her increased pain.  She has not noticed any improvement in her myalgias or fatigue since starting to take the vitamin D supplement that was prescribed.  She continues to have pain in multiple joints especially both wrist joints, both hands, and her right hip.  She has had some difficulty with ambulating and lying on her right side at night due to right hip pain.  She states that her left knee joint pain and inflammation has improved since having a cortisone injection performed on 08/09/2019. Patient reports that she has been experiencing increased pain, dryness, and photophobia in both eyes over the past several months.  She states that the eye pain seems to be intermittent.  She has seen an optometrist in the past but has not been able to follow-up due to no available appointments.   Activities of Daily Living:  Patient reports joint stiffness all day  Patient Reports nocturnal pain.  Difficulty dressing/grooming: Reports Difficulty  climbing stairs: Reports Difficulty getting out of chair: Reports Difficulty using hands for taps, buttons, cutlery, and/or writing: Reports  Review of Systems  Constitutional: Positive for fatigue.  HENT: Positive for mouth dryness. Negative for mouth sores and nose dryness.   Eyes: Positive for dryness. Negative for pain and visual disturbance.  Respiratory: Negative for cough, hemoptysis, shortness of breath and difficulty breathing.   Cardiovascular: Negative for chest pain, palpitations, hypertension and swelling in legs/feet.  Gastrointestinal: Negative for blood in stool, constipation and diarrhea.  Endocrine: Negative for increased urination.  Genitourinary: Negative for painful urination.  Musculoskeletal: Positive for arthralgias, joint pain and morning stiffness. Negative for joint swelling, myalgias, muscle weakness, muscle tenderness and myalgias.  Skin: Positive for rash. Negative for color change, pallor, hair loss, nodules/bumps, skin tightness, ulcers and sensitivity to sunlight.  Allergic/Immunologic: Negative for susceptible to infections.  Neurological: Negative for dizziness, numbness, headaches and weakness.  Hematological: Negative for swollen glands.  Psychiatric/Behavioral: Positive for depressed mood and sleep disturbance. The patient is nervous/anxious.     PMFS History:  Patient Active Problem List   Diagnosis Date Noted   Chronic pain syndrome 07/01/2019   Anxiety and depression 07/01/2019   History of gastroesophageal reflux (GERD) 07/01/2019   History of type 2 diabetes mellitus 07/01/2019   Essential hypertension 07/01/2019   Elevated cholesterol 07/01/2019    Past Medical History:  Diagnosis Date  Diabetes mellitus    Hypertension    Rheumatoid arthritis (HCC)     Family History  Problem Relation Age of Onset   Diabetes Mother    Hypertension Mother    Rheum arthritis Mother    Heart attack Father    Hypertension Brother     History reviewed. No pertinent surgical history. Social History   Social History Narrative   Not on file    There is no immunization history on file for this patient.   Objective: Vital Signs: BP (!) 142/98 (BP Location: Left Arm, Patient Position: Sitting, Cuff Size: Large)    Pulse 90    Resp 13    Ht 5\' 4"  (1.626 m)    Wt 219 lb 12.8 oz (99.7 kg)    BMI 37.73 kg/m    Physical Exam Vitals and nursing note reviewed.  Constitutional:      Appearance: She is well-developed.  HENT:     Head: Normocephalic and atraumatic.  Eyes:     Conjunctiva/sclera: Conjunctivae normal.  Pulmonary:     Effort: Pulmonary effort is normal.  Abdominal:     General: Bowel sounds are normal.     Palpations: Abdomen is soft.  Musculoskeletal:     Cervical back: Normal range of motion.  Lymphadenopathy:     Cervical: No cervical adenopathy.  Skin:    General: Skin is warm and dry.     Capillary Refill: Capillary refill takes less than 2 seconds.  Neurological:     Mental Status: She is alert and oriented to person, place, and time.  Psychiatric:        Behavior: Behavior normal.      Musculoskeletal Exam: C-spine has slightly limited range of motion with discomfort.  Thoracic and lumbar spine good range of motion.  Shoulder joints have good range of motion with discomfort bilaterally.  Elbow joints have good range of motion with no tenderness or inflammation.  She has tenderness of the right wrist joint on exam.  Tenderness and synovitis of the left wrist noted.  She has tenderness and synovitis of the right first, second, and fifth MCP joints.  Tenderness of several MCP and PIP joints as described below.  Discomfort with right hip ROM.  Tenderness over the right trochanteric bursa. She has good range of motion of both knee joints on exam.  No warmth or effusion was noted.  She has tenderness of both knees.  Tenderness of both ankle joints noted.  CDAI Exam: CDAI Score: 20.2  Patient Global:  7 mm; Provider Global: 5 mm Swollen: 4 ; Tender: 18  Joint Exam 09/22/2019      Right  Left  Glenohumeral   Tender   Tender  Wrist   Tender  Swollen Tender  MCP 1  Swollen Tender   Tender  MCP 2  Swollen Tender   Tender  MCP 4   Tender     MCP 5  Swollen Tender     IP   Tender     PIP 2   Tender     PIP 3   Tender     Hip   Tender     Knee   Tender   Tender  Ankle   Tender   Tender     Investigation: No additional findings.  Imaging: No results found.  Recent Labs: Lab Results  Component Value Date   WBC 9.2 08/17/2019   HGB 10.8 (L) 08/17/2019   PLT 446 (H) 08/17/2019  NA 138 08/17/2019   K 4.5 08/17/2019   CL 104 08/17/2019   CO2 26 08/17/2019   GLUCOSE 117 (H) 08/17/2019   BUN 10 08/17/2019   CREATININE 0.77 08/17/2019   BILITOT 0.3 08/17/2019   ALKPHOS 61 07/01/2011   AST 12 08/17/2019   ALT 16 08/17/2019   PROT 6.9 08/17/2019   ALBUMIN 3.8 07/01/2011   CALCIUM 9.2 08/17/2019   GFRAA 104 08/17/2019   QFTBGOLDPLUS NEGATIVE 07/01/2019    Speciality Comments: No specialty comments available.  Procedures:  No procedures performed Allergies: Patient has no known allergies.   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  She continues to have significant pain and stiffness in multiple joints including both wrist joints, both hands, and the right hip joint.  She is currently on Enbrel 50 mg subcutaneous injections once weekly, methotrexate 0.8 mL injections once weekly, and folic acid 2 mg by mouth daily.  She was started on Enbrel on 08/26/2019 and is due for her fourth injection tomorrow.  According to the patient she experiences increased pain and stiffness 1 to 2  days prior to her injections.  She has completed the prednisone taper and her joint inflammation has returned.   She was given a prescription for Voltaren gel which she can apply topically as needed for pain relief.  We discussed the importance of avoiding NSAIDs. She will start taking prednisone 5 mg 1 tablet by mouth daily to control the inflammation she is experiencing while allowing enbrel and MTX more time.  Side effects of long-term use were discussed today.  She will follow-up in the office in 2 months to assess her response.- Plan: predniSONE (DELTASONE) 5 MG tablet, diclofenac Sodium (VOLTAREN) 1 % GEL, Ambulatory referral to Ophthalmology  High risk medication use - Enbrel 50 mg sq injections once weekly (started on 08/26/19), methotrexate 0.8 ml sq injections once weekly, and folic acid 2 mg po daily.  Lab work from 08/17/2019 was reviewed today in the office.  She is due to update lab work today.  Orders for CBC and CMP were released.  She will update lab work in October and every 3 months to monitor for drug toxicity.  Standing orders for CBC and CMP are in place.  A refill of methotrexate was sent to the pharmacy today.  She was given a sample of Rasuvo today in the office so she does not have to miss her dose of methotrexate tomorrow.- Plan: COMPLETE METABOLIC PANEL WITH GFR, CBC with Differential/Platelet  Chondromalacia patellae, left knee: Mild-She has good range of motion of the left knee joint on exam.  No warmth or effusion was noted.  She had a cortisone injection performed on 08/09/2019 which improved her symptoms significantly.  She was given a prescription of Voltaren gel today which she can apply topically as needed for pain relief.  Chondromalacia patellae, right knee: X-rays of the right knee were obtained on 08/09/2019 and were reviewed again today in the office on 09/22/2019.  She has good range of motion of the right knee joint on exam.  Crepitus was noted.  No warmth or effusion was  noted.  She has difficulty climbing steps and getting up from a seated position due to the pain  and stiffness she experiences.  She was given a prescription for Voltaren gel which she can apply topically.  Trochanteric bursitis of right hip: She presents today with right hip and trochanteric bursa tenderness.  She has had some difficulty ambulating and laying on her right side at night due to the discomfort.  She has slightly limited range of motion of the right hip on exam.  We discussed that she likely has inflammation in the right hip joint.  She will require more time on Enbrel and Rasuvo combination.  She was given a prescription for prednisone 5 mg 1 tablet by mouth daily.  She was also given a handout of exercises to perform.  She was advised to notify us if her symptoms persist or worsen.  Arthropathy of lumbar facet joint: She experiences intermittent lower back pain.  She has no symptoms of radiculopathy at this time.  Pain of both eyes -She has been experiencing intermittent eye dryness, eye pain, and photophobia bilaterally.  She has not been evaluated by an ophthalmologist in the past.  Due to the new diagnosis of rheumatoid arthritis we will refer her to ophthalmology for further evaluation.  plan: Ambulatory referral to Ophthalmology  Vitamin D deficiency: She is taking vitamin D 50,000 units 1 capsule by mouth once weekly.  Her vitamin D was 28 on 08/17/2019.  We will recheck vitamin D in September 2021.   Abnormal SPEP - Referred to Dr. Myna Hidalgo.  Patient no showed her second appointment and declined rescheduling.  Chronic pain syndrome - She takes Lyrica as prescribed.  She previously was taking Cymbalta 30 mg 1 capsule by mouth daily.  She was encouraged to restart Cymbalta.  Other medical conditions are listed as follows:  Family history of rheumatoid arthritis  Anxiety and depression  Essential hypertension  Elevated cholesterol  History of type 2 diabetes  mellitus  History of gastroesophageal reflux (GERD)  Orders: Orders Placed This Encounter  Procedures   COMPLETE METABOLIC PANEL WITH GFR   CBC with Differential/Platelet   Ambulatory referral to Ophthalmology   Meds ordered this encounter  Medications   predniSONE (DELTASONE) 5 MG tablet    Sig: Take 1 tablet (5 mg total) by mouth daily with breakfast.    Dispense:  30 tablet    Refill:  0   diclofenac Sodium (VOLTAREN) 1 % GEL    Sig: Apply 2 grams to 4 grams to the affected area up to 4 times daily as needed.    Dispense:  400 g    Refill:  4   methotrexate 50 MG/2ML injection    Sig: Inject 0.8 mLs (20 mg total) into the skin once a week.    Dispense:  10 mL    Refill:  0    Face-to-face time spent with patient was 30 minutes. Greater than 50% of time was spent in counseling and coordination of care.  Follow-Up Instructions: Return in about 2 months (around 11/23/2019) for Rheumatoid arthritis.   Gearldine Bienenstock, PA-C  Note - This record has been created using Dragon software.  Chart creation errors have been sought, but may not always  have been located. Such creation errors do not reflect on  the standard of medical care.

## 2019-09-22 ENCOUNTER — Other Ambulatory Visit: Payer: Self-pay

## 2019-09-22 ENCOUNTER — Encounter: Payer: Self-pay | Admitting: Physician Assistant

## 2019-09-22 ENCOUNTER — Ambulatory Visit (INDEPENDENT_AMBULATORY_CARE_PROVIDER_SITE_OTHER): Payer: 59 | Admitting: Physician Assistant

## 2019-09-22 VITALS — BP 142/98 | HR 90 | Resp 13 | Ht 64.0 in | Wt 219.8 lb

## 2019-09-22 DIAGNOSIS — F32A Depression, unspecified: Secondary | ICD-10-CM

## 2019-09-22 DIAGNOSIS — M0579 Rheumatoid arthritis with rheumatoid factor of multiple sites without organ or systems involvement: Secondary | ICD-10-CM | POA: Diagnosis not present

## 2019-09-22 DIAGNOSIS — F419 Anxiety disorder, unspecified: Secondary | ICD-10-CM

## 2019-09-22 DIAGNOSIS — M47816 Spondylosis without myelopathy or radiculopathy, lumbar region: Secondary | ICD-10-CM

## 2019-09-22 DIAGNOSIS — R778 Other specified abnormalities of plasma proteins: Secondary | ICD-10-CM

## 2019-09-22 DIAGNOSIS — M2241 Chondromalacia patellae, right knee: Secondary | ICD-10-CM

## 2019-09-22 DIAGNOSIS — M2242 Chondromalacia patellae, left knee: Secondary | ICD-10-CM | POA: Diagnosis not present

## 2019-09-22 DIAGNOSIS — M7061 Trochanteric bursitis, right hip: Secondary | ICD-10-CM

## 2019-09-22 DIAGNOSIS — Z79899 Other long term (current) drug therapy: Secondary | ICD-10-CM | POA: Diagnosis not present

## 2019-09-22 DIAGNOSIS — H5713 Ocular pain, bilateral: Secondary | ICD-10-CM

## 2019-09-22 DIAGNOSIS — Z8719 Personal history of other diseases of the digestive system: Secondary | ICD-10-CM

## 2019-09-22 DIAGNOSIS — Z8261 Family history of arthritis: Secondary | ICD-10-CM

## 2019-09-22 DIAGNOSIS — E7800 Pure hypercholesterolemia, unspecified: Secondary | ICD-10-CM

## 2019-09-22 DIAGNOSIS — E559 Vitamin D deficiency, unspecified: Secondary | ICD-10-CM

## 2019-09-22 DIAGNOSIS — I10 Essential (primary) hypertension: Secondary | ICD-10-CM

## 2019-09-22 DIAGNOSIS — Z8639 Personal history of other endocrine, nutritional and metabolic disease: Secondary | ICD-10-CM

## 2019-09-22 DIAGNOSIS — F329 Major depressive disorder, single episode, unspecified: Secondary | ICD-10-CM

## 2019-09-22 DIAGNOSIS — G894 Chronic pain syndrome: Secondary | ICD-10-CM

## 2019-09-22 LAB — CBC WITH DIFFERENTIAL/PLATELET
Absolute Monocytes: 488 cells/uL (ref 200–950)
Basophils Absolute: 59 cells/uL (ref 0–200)
Basophils Relative: 0.8 %
Eosinophils Absolute: 67 cells/uL (ref 15–500)
Eosinophils Relative: 0.9 %
HCT: 37 % (ref 35.0–45.0)
Hemoglobin: 11.2 g/dL — ABNORMAL LOW (ref 11.7–15.5)
Lymphs Abs: 3189 cells/uL (ref 850–3900)
MCH: 21 pg — ABNORMAL LOW (ref 27.0–33.0)
MCHC: 30.3 g/dL — ABNORMAL LOW (ref 32.0–36.0)
MCV: 69.4 fL — ABNORMAL LOW (ref 80.0–100.0)
MPV: 9.7 fL (ref 7.5–12.5)
Monocytes Relative: 6.6 %
Neutro Abs: 3596 cells/uL (ref 1500–7800)
Neutrophils Relative %: 48.6 %
Platelets: 420 10*3/uL — ABNORMAL HIGH (ref 140–400)
RBC: 5.33 10*6/uL — ABNORMAL HIGH (ref 3.80–5.10)
RDW: 23 % — ABNORMAL HIGH (ref 11.0–15.0)
Total Lymphocyte: 43.1 %
WBC: 7.4 10*3/uL (ref 3.8–10.8)

## 2019-09-22 LAB — COMPLETE METABOLIC PANEL WITH GFR
AG Ratio: 1.2 (calc) (ref 1.0–2.5)
ALT: 9 U/L (ref 6–29)
AST: 12 U/L (ref 10–35)
Albumin: 3.8 g/dL (ref 3.6–5.1)
Alkaline phosphatase (APISO): 70 U/L (ref 37–153)
BUN: 8 mg/dL (ref 7–25)
CO2: 30 mmol/L (ref 20–32)
Calcium: 9.5 mg/dL (ref 8.6–10.4)
Chloride: 105 mmol/L (ref 98–110)
Creat: 0.78 mg/dL (ref 0.50–1.05)
GFR, Est African American: 103 mL/min/{1.73_m2} (ref >=60)
GFR, Est Non African American: 89 mL/min/{1.73_m2} (ref >=60)
Globulin: 3.1 g/dL (calc) (ref 1.9–3.7)
Glucose, Bld: 77 mg/dL (ref 65–99)
Potassium: 4.3 mmol/L (ref 3.5–5.3)
Sodium: 141 mmol/L (ref 135–146)
Total Bilirubin: 0.3 mg/dL (ref 0.2–1.2)
Total Protein: 6.9 g/dL (ref 6.1–8.1)

## 2019-09-22 MED ORDER — PREDNISONE 5 MG PO TABS: 5.0000 mg | ORAL_TABLET | Freq: Every day | ORAL | 0 refills | Status: DC

## 2019-09-22 MED ORDER — METHOTREXATE SODIUM CHEMO INJECTION 50 MG/2ML: 20.0000 mg | INTRAMUSCULAR | 0 refills | Status: DC

## 2019-09-22 MED ORDER — DICLOFENAC SODIUM 1 % EX GEL
CUTANEOUS | 4 refills | Status: DC
Start: 2019-09-22 — End: 2020-06-20

## 2019-09-22 NOTE — Patient Instructions (Signed)
Hip Bursitis Rehab Ask your health care provider which exercises are safe for you. Do exercises exactly as told by your health care provider and adjust them as directed. It is normal to feel mild stretching, pulling, tightness, or discomfort as you do these exercises. Stop right away if you feel sudden pain or your pain gets worse. Do not begin these exercises until told by your health care provider. Stretching exercise This exercise warms up your muscles and joints and improves the movement and flexibility of your hip. This exercise also helps to relieve pain and stiffness. Iliotibial band stretch An iliotibial band is a strong band of muscle tissue that runs from the outer side of your hip to the outer side of your thigh and knee. 1. Lie on your side with your left / right leg in the top position. 2. Bend your left / right knee and grab your ankle. Stretch out your bottom arm to help you balance. 3. Slowly bring your knee back so your thigh is behind your body. 4. Slowly lower your knee toward the floor until you feel a gentle stretch on the outside of your left / right thigh. If you do not feel a stretch and your knee will not fall farther, place the heel of your other foot on top of your knee and pull your knee down toward the floor with your foot. 5. Hold this position for __________ seconds. 6. Slowly return to the starting position. Repeat __________ times. Complete this exercise __________ times a day. Strengthening exercises These exercises build strength and endurance in your hip and pelvis. Endurance is the ability to use your muscles for a long time, even after they get tired. Bridge This exercise strengthens the muscles that move your thigh backward (hip extensors). 1. Lie on your back on a firm surface with your knees bent and your feet flat on the floor. 2. Tighten your buttocks muscles and lift your buttocks off the floor until your trunk is level with your thighs. ? Do not arch  your back. ? You should feel the muscles working in your buttocks and the back of your thighs. If you do not feel these muscles, slide your feet 1-2 inches (2.5-5 cm) farther away from your buttocks. ? If this exercise is too easy, try doing it with your arms crossed over your chest. 3. Hold this position for __________ seconds. 4. Slowly lower your hips to the starting position. 5. Let your muscles relax completely after each repetition. Repeat __________ times. Complete this exercise __________ times a day. Squats This exercise strengthens the muscles in front of your thigh and knee (quadriceps). 1. Stand in front of a table, with your feet and knees pointing straight ahead. You may rest your hands on the table for balance but not for support. 2. Slowly bend your knees and lower your hips like you are going to sit in a chair. ? Keep your weight over your heels, not over your toes. ? Keep your lower legs upright so they are parallel with the table legs. ? Do not let your hips go lower than your knees. ? Do not bend lower than told by your health care provider. ? If your hip pain increases, do not bend as low. 3. Hold the squat position for __________ seconds. 4. Slowly push with your legs to return to standing. Do not use your hands to pull yourself to standing. Repeat __________ times. Complete this exercise __________ times a day. Hip hike 1. Stand   sideways on a bottom step. Stand on your left / right leg with your other foot unsupported next to the step. You can hold on to the railing or wall for balance if needed. 2. Keep your knees straight and your torso square. Then lift your left / right hip up toward the ceiling. 3. Hold this position for __________ seconds. 4. Slowly let your left / right hip lower toward the floor, past the starting position. Your foot should get closer to the floor. Do not lean or bend your knees. Repeat __________ times. Complete this exercise __________ times a  day. Single leg stand 1. Without shoes, stand near a railing or in a doorway. You may hold on to the railing or door frame as needed for balance. 2. Squeeze your left / right buttock muscles, then lift up your other foot. ? Do not let your left / right hip push out to the side. ? It is helpful to stand in front of a mirror for this exercise so you can watch your hip. 3. Hold this position for __________ seconds. Repeat __________ times. Complete this exercise __________ times a day. This information is not intended to replace advice given to you by your health care provider. Make sure you discuss any questions you have with your health care provider. Document Revised: 06/29/2018 Document Reviewed: 06/29/2018 Elsevier Patient Education  2020 [employer]  

## 2019-09-22 NOTE — Progress Notes (Signed)
Medication Samples have been provided to the patient.  Drug name: Rasuvo  Strength: 20mg     Qty: 1 LOT AB Exp.Date: 05/2020  Dosing instructions: Inject 20mg  into the skin once weekly.   The patient has been instructed regarding the correct time, dose, and frequency of taking this medication, including desired effects and most common side effects.   06/2020 11:44 AM 09/22/2019

## 2019-09-23 NOTE — Progress Notes (Signed)
CBC WNL.  Hemoglobin is low but trending up.  Plts are mildly elevated but trending down.

## 2019-10-04 ENCOUNTER — Inpatient Hospital Stay: Payer: 59

## 2019-10-04 ENCOUNTER — Inpatient Hospital Stay: Payer: 59 | Admitting: Hematology & Oncology

## 2019-10-04 ENCOUNTER — Encounter: Payer: Self-pay | Admitting: *Deleted

## 2019-10-04 ENCOUNTER — Telehealth: Payer: Self-pay

## 2019-10-04 DIAGNOSIS — R778 Other specified abnormalities of plasma proteins: Secondary | ICD-10-CM

## 2019-10-04 NOTE — Telephone Encounter (Signed)
Please notify patient. 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  Bo Merino, MD       Previous Messages   ----- Message -----  From: Earnestine Mealing, CMA  Sent: 10/04/2019 11:41 AM EDT  To: Bo Merino, MD  Subject: FW: 7/19 APPT                   FYI: please see message below.  I will contact patient.  ----- Message -----  From: Donita Brooks D  Sent: 10/04/2019  9:40 AM EDT  To: Earnestine Mealing, CMA  Subject: 7/19 APPT                     Patient called this mornin to cancel her appts for later on this AM. She has also no showed x2. Per our Oncology Navigator we cannot reschedule any appointments. Cna you please advise your patient.    Thanks  [employer]

## 2019-10-04 NOTE — Telephone Encounter (Signed)
I called the patient and advised the appointment will not be rescheduled due to no show/cancellation. Advised patient we could refer her to [facility] Hematology/Oncology. I stressed the importance of the appointment and Patient verbalized understanding. Referral placed for [facility].  (discussed with Sherron Ales, PA-C and [facility] referral placed).

## 2019-10-04 NOTE — Progress Notes (Signed)
After sending message to patient's referring provider on 08/31/19 received request to reschedule patient as MD had spoken directly to patient and she agreed to be seen.  Patient scheduled for appointment on 10/04/19. This morning patient called and cancelled this appointment. This is the third new patient appointment where patient has not been seen. After discussion with clinic director, it was decided that we will not reschedule patient's appointment. Referring provider notified by scheduling.

## 2019-10-12 ENCOUNTER — Telehealth: Payer: Self-pay | Admitting: Pharmacy Technician

## 2019-10-12 NOTE — Telephone Encounter (Signed)
Submitted a Prior Authorization request to [facility] for ENBREL via fax. Will update once we receive a response.

## 2019-10-13 ENCOUNTER — Ambulatory Visit: Payer: 59 | Admitting: Hematology & Oncology

## 2019-10-13 ENCOUNTER — Inpatient Hospital Stay: Payer: 59

## 2019-10-15 NOTE — Telephone Encounter (Signed)
Received notification from [facility] regarding a prior authorization for ENBREL. Authorization has been APPROVED from 11/08/19 to 02/08/20.

## 2019-11-08 ENCOUNTER — Other Ambulatory Visit: Payer: Self-pay | Admitting: Pharmacist

## 2019-11-08 DIAGNOSIS — M0579 Rheumatoid arthritis with rheumatoid factor of multiple sites without organ or systems involvement: Secondary | ICD-10-CM

## 2019-11-08 NOTE — Telephone Encounter (Signed)
Patient started Enbrel in June 2021.  Last Visit: 09/22/19 Next Visit: 11/24/19 Labs: CBC/CMP stable 09/22/19 TB Gold: negative 07/01/19   Last refill sent on 08/26/19 for 12 pens for 90 day supply.  Refill of Enbrel every 7 days 12 pens for 90 day supply approved and sent to pharmacy.   Verlin Fester, PharmD, [place], CPP Clinical Specialty Pharmacist (Rheumatology and Pulmonology)  11/08/2019 3:18 PM

## 2019-11-10 ENCOUNTER — Other Ambulatory Visit: Payer: Self-pay | Admitting: Physician Assistant

## 2019-11-10 ENCOUNTER — Other Ambulatory Visit: Payer: Self-pay | Admitting: *Deleted

## 2019-11-10 MED ORDER — METHOTREXATE SODIUM CHEMO INJECTION 50 MG/2ML: 20.0000 mg | INTRAMUSCULAR | 0 refills | Status: DC

## 2019-11-10 NOTE — Progress Notes (Deleted)
Office Visit Note  Patient: Jeanette Potts             Date of Birth: 1970-08-21           MRN: [id number]             PCP: Laruth Bouchard, MD Referring: Laruth Bouchard, MD Visit Date: 11/24/2019 Occupation: @GUAROCC @  Subjective:  No chief complaint on file.   History of Present Illness: Jeanette Potts is a 49 y.o. female ***   Activities of Daily Living:  Patient reports morning stiffness for *** {minute/hour:19697}.   Patient {ACTIONS;DENIES/REPORTS:21021675::"Denies"} nocturnal pain.  Difficulty dressing/grooming: {ACTIONS;DENIES/REPORTS:21021675::"Denies"} Difficulty climbing stairs: {ACTIONS;DENIES/REPORTS:21021675::"Denies"} Difficulty getting out of chair: {ACTIONS;DENIES/REPORTS:21021675::"Denies"} Difficulty using hands for taps, buttons, cutlery, and/or writing: {ACTIONS;DENIES/REPORTS:21021675::"Denies"}  No Rheumatology ROS completed.   PMFS History:  Patient Active Problem List   Diagnosis Date Noted  . Chronic pain syndrome 07/01/2019  . Anxiety and depression 07/01/2019  . History of gastroesophageal reflux (GERD) 07/01/2019  . History of type 2 diabetes mellitus 07/01/2019  . Essential hypertension 07/01/2019  . Elevated cholesterol 07/01/2019    Past Medical History:  Diagnosis Date  . Diabetes mellitus   . Hypertension   . Rheumatoid arthritis (HCC)     Family History  Problem Relation Age of Onset  . Diabetes Mother   . Hypertension Mother   . Rheum arthritis Mother   . Heart attack Father   . Hypertension Brother    No past surgical history on file. Social History   Social History Narrative  . Not on file    There is no immunization history on file for this patient.   Objective: Vital Signs: There were no vitals taken for this visit.   Physical Exam   Musculoskeletal Exam: ***  CDAI Exam: CDAI Score: -- Patient Global: --; Provider Global: -- Swollen: --; Tender: -- Joint Exam 11/24/2019   No joint exam has been documented  for this visit   There is currently no information documented on the homunculus. Go to the Rheumatology activity and complete the homunculus joint exam.  Investigation: No additional findings.  Imaging: No results found.  Recent Labs: Lab Results  Component Value Date   WBC 7.4 09/22/2019   HGB 11.2 (L) 09/22/2019   PLT 420 (H) 09/22/2019   NA 141 09/22/2019   K 4.3 09/22/2019   CL 105 09/22/2019   CO2 30 09/22/2019   GLUCOSE 77 09/22/2019   BUN 8 09/22/2019   CREATININE 0.78 09/22/2019   BILITOT 0.3 09/22/2019   ALKPHOS 61 07/01/2011   AST 12 09/22/2019   ALT 9 09/22/2019   PROT 6.9 09/22/2019   ALBUMIN 3.8 07/01/2011   CALCIUM 9.5 09/22/2019   GFRAA 103 09/22/2019   QFTBGOLDPLUS NEGATIVE 07/01/2019    Speciality Comments: No specialty comments available.  Procedures:  No procedures performed Allergies: Patient has no known allergies.   Assessment / Plan:     Visit Diagnoses: No diagnosis found.  Orders: No orders of the defined types were placed in this encounter.  No orders of the defined types were placed in this encounter.   Face-to-face time spent with patient was *** minutes. Greater than 50% of time was spent in counseling and coordination of care.  Follow-Up Instructions: No follow-ups on file.   07/03/2019, CMA  Note - This record has been created using Ellen Henri.  Chart creation errors have been sought, but may not always  have been located. Such creation errors do not reflect on  the standard of medical care. 

## 2019-11-10 NOTE — Telephone Encounter (Signed)
Patient contact the office stating she only had enough MTX for one more dose. Patient requesting refill to be sent to the office. Patient states she only received 4 vials instead of 5 at her last refill form the pharmacy.   Last Visit: 09/22/19 Next Visit: 11/24/19 Labs: CBC/CMP stable 09/22/19  Current Dose per office note on 09/22/2019: methotrexate 0.8 mL injections once weekly

## 2019-11-15 ENCOUNTER — Telehealth: Payer: Self-pay | Admitting: Rheumatology

## 2019-11-15 DIAGNOSIS — M0579 Rheumatoid arthritis with rheumatoid factor of multiple sites without organ or systems involvement: Secondary | ICD-10-CM

## 2019-11-15 MED ORDER — PREDNISONE 5 MG PO TABS: 5.0000 mg | ORAL_TABLET | Freq: Every day | ORAL | 0 refills | Status: DC

## 2019-11-15 NOTE — Progress Notes (Signed)
Office Visit Note  Patient: Jeanette Potts             Date of Birth: [birth year]           MRN: [id number]             PCP: Laruth Bouchard, MD Referring: Laruth Bouchard, MD Visit Date: 11/17/2019 Occupation: @GUAROCC @  Subjective:  Pain in multiple joints   History of Present Illness: Clauda Roe is a 49 y.o. female with history of seropositive rheumatoid arthritis and osteoarthritis.  Patient is on Enbrel 50 mg subcutaneous injections once weekly, methotrexate 0.8 mL subcu injections once weekly, and folic acid 2 mg by mouth daily.  She is due for her injections tomorrow.  According to the patient she experiences increased pain and swelling 2 days prior to her injections on a weekly basis.  She continues to take prednisone 5 mg 1 tablet by mouth daily is taking up to 6 tablets of Tylenol on a daily basis for pain relief.  She continues to have pain in multiple joints including both shoulders, both wrists, both knee joints, and both ankle joints.  She notices intermittent swelling in her wrist joints and ankle joints.  The swelling is most severe in the left wrist and left ankle.  She continues to experience nocturnal pain and difficulty with ADLs.   Activities of Daily Living:  Patient reports joint stiffness all day  Patient Reports nocturnal pain.  Difficulty dressing/grooming: Reports Difficulty climbing stairs: Reports Difficulty getting out of chair: Reports Difficulty using hands for taps, buttons, cutlery, and/or writing: Reports  Review of Systems  Constitutional: Positive for fatigue.  HENT: Positive for mouth dryness and nose dryness. Negative for mouth sores.   Eyes: Positive for dryness. Negative for pain and visual disturbance.  Respiratory: Negative for cough, hemoptysis, shortness of breath and difficulty breathing.   Cardiovascular: Negative for chest pain, palpitations, hypertension and swelling in legs/feet.  Gastrointestinal: Negative for blood in stool,  constipation and diarrhea.  Endocrine: Negative for increased urination.  Genitourinary: Negative for difficulty urinating and painful urination.  Musculoskeletal: Positive for arthralgias, joint pain, joint swelling, myalgias, morning stiffness, muscle tenderness and myalgias. Negative for muscle weakness.  Skin: Positive for rash. Negative for color change, pallor, hair loss, nodules/bumps, skin tightness, ulcers and sensitivity to sunlight.  Allergic/Immunologic: Negative for susceptible to infections.  Neurological: Negative for dizziness and headaches.  Hematological: Positive for bruising/bleeding tendency. Negative for swollen glands.  Psychiatric/Behavioral: Positive for sleep disturbance. Negative for depressed mood and confusion. The patient is not nervous/anxious.     PMFS History:  Patient Active Problem List   Diagnosis Date Noted  . Chronic pain syndrome 07/01/2019  . Anxiety and depression 07/01/2019  . History of gastroesophageal reflux (GERD) 07/01/2019  . History of type 2 diabetes mellitus 07/01/2019  . Essential hypertension 07/01/2019  . Elevated cholesterol 07/01/2019    Past Medical History:  Diagnosis Date  . Diabetes mellitus   . Hypertension   . Rheumatoid arthritis (HCC)     Family History  Problem Relation Age of Onset  . Diabetes Mother   . Hypertension Mother   . Rheum arthritis Mother   . Heart attack Father   . Hypertension Brother    History reviewed. No pertinent surgical history. Social History   Social History Narrative  . Not on file    There is no immunization history on file for this patient.   Objective: Vital Signs: BP (!) 157/108 (BP Location: Left Arm, Patient  Position: Sitting, Cuff Size: Normal)   Pulse 91   Resp 16   Ht 5\' 5"  (1.651 m)   Wt 221 lb (100.2 kg)   BMI 36.78 kg/m    Physical Exam Vitals and nursing note reviewed.  Constitutional:      Appearance: She is well-developed.  HENT:     Head: Normocephalic  and atraumatic.  Eyes:     Conjunctiva/sclera: Conjunctivae normal.  Pulmonary:     Effort: Pulmonary effort is normal.  Abdominal:     Palpations: Abdomen is soft.  Musculoskeletal:     Cervical back: Normal range of motion.  Skin:    General: Skin is warm and dry.     Capillary Refill: Capillary refill takes less than 2 seconds.  Neurological:     Mental Status: She is alert and oriented to person, place, and time.  Psychiatric:        Behavior: Behavior normal.      Musculoskeletal Exam: C-spine, thoracic spine, and lumbar spine good ROM.  Shoulder joints painful ROM bilaterally.  Tenderness of both shoulder joints.  Tenderness of the left elbow joint.  Tenderness of the right wrist joint.  Tenderness and inflammation in the dorsal aspect of the left wrist joint.  Tenderness of the right 2nd-4th MCP and PIP joints and left 1st-5th MCP joints and 2nd, 3rd, and 4th PIP joints.  Painful ROM of both hip joints.  Knee joints are tender but no warmth or effusion noted.  Tenderness and swelling of the left ankle joint.   CDAI Exam: CDAI Score: 23.5  Patient Global: 9 mm; Provider Global: 6 mm Swollen: 2 ; Tender: 22  Joint Exam 11/17/2019      Right  Left  Glenohumeral   Tender   Tender  Elbow      Tender  Wrist     Swollen Tender  MCP 1      Tender  MCP 2   Tender   Tender  MCP 3   Tender   Tender  MCP 4   Tender   Tender  MCP 5      Tender  PIP 2   Tender   Tender  PIP 3   Tender   Tender  PIP 4   Tender   Tender  PIP 5      Tender  Knee   Tender   Tender  Ankle     Swollen Tender     Investigation: No additional findings.  Imaging: No results found.  Recent Labs: Lab Results  Component Value Date   WBC 7.4 09/22/2019   HGB 11.2 (L) 09/22/2019   PLT 420 (H) 09/22/2019   NA 141 09/22/2019   K 4.3 09/22/2019   CL 105 09/22/2019   CO2 30 09/22/2019   GLUCOSE 77 09/22/2019   BUN 8 09/22/2019   CREATININE 0.78 09/22/2019   BILITOT 0.3 09/22/2019   ALKPHOS 61  07/01/2011   AST 12 09/22/2019   ALT 9 09/22/2019   PROT 6.9 09/22/2019   ALBUMIN 3.8 07/01/2011   CALCIUM 9.5 09/22/2019   GFRAA 103 09/22/2019   QFTBGOLDPLUS NEGATIVE 07/01/2019    Speciality Comments: No specialty comments available.  Procedures:  No procedures performed Allergies: Patient has no known allergies.   Assessment / Plan:     Visit Diagnoses: Rheumatoid arthritis involving multiple sites with positive rheumatoid factor (HCC) - Inflammatory erosive arthritis with positive rheumatoid factor, positive anti-CCP, +14 3 3  eta, positive ANA, ENA negative, and elevated sed  rate: She has ongoing joint tenderness and synovitis as described above.  She continues to have chronic pain and intermittent inflammation in multiple joints including both shoulders, left elbow joint, both wrist joints, both knees, and both ankle joints.  She has tenderness and synovitis of the left wrist and left ankle joint on exam.  She has difficulty performing ADLs due to the severity of pain and joint stiffness.  She continues to take prednisone 5 mg 1 tablet by mouth daily and has ongoing joint inflammation.  She is currently on Enbrel 50 mg subcutaneous injections once weekly, methotrexate 0.8 mL subcu injections once weekly, and folic acid 2 mg by mouth daily.  She has not missed any doses of Enbrel or methotrexate recently.  According to the patient 2 days prior to her injections on a weekly basis she develops increased joint pain and inflammation.  She is due for both injections tomorrow.  Different treatment options were discussed today in detail.  We will proceed with applying for Humira 40 mg subcutaneous injections every 14 days.  Indications, contraindications, potential side effects of Humira were discussed today.  All questions were addressed and consent was obtained.  Once Humira has been approved she will return to the office for the administration of the first injection.  She will be discontinuing  Enbrel and continue on methotrexate 0.8 mL subcu injections once weekly.  She voiced understanding.  She will continue taking prednisone 5 mg 1 tablet by mouth daily.  We discussed that ideally we would like to get her off of prednisone and Tylenol.  She will follow-up in 6 weeks to assess her response to Humira and methotrexate combination therapy.   Counseled patient that Humira is a TNF blocking agent.  Counseled patient on purpose, proper use, and adverse effects of Humira.  Reviewed the most common adverse effects including infections, headache, and injection site reactions. Discussed that there is the possibility of an increased risk of malignancy but it is not well understood if this increased risk is due to the medication or the disease state.  Advised patient to get yearly dermatology exams due to risk of skin cancer. Counseled patient that Humira should be held prior to scheduled surgery.  Counseled patient to avoid live vaccines while on Humira.  Advised patient to get annual influenza vaccine and the pneumococcal vaccine as indicated.    Reviewed the importance of regular labs while on Humira therapy.  Standing orders placed.  Provided patient with medication education material and answered all questions.  Patient consented to Humira.  Will upload consent into the media tab.  Reviewed storage instructions of Humira.  Advised initial injection must be administered in office.  Patient verbalized understanding.  Dose will be for rheumatoid arthritis Humira 40 mg every 14 days.  Prescription pending lab results and/or insurance approval.  High risk medication use -Applying for Humira 40 mg subcutaneous injections once every 14 days.  Once approved she will return to the office for the administration of the first injection.  She will be discontinuing Enbrel (initially started on 08/26/19) due to an inadequate response. She will continue on methotrexate 0.8 ml sq injections once weekly and folic acid 2  mg po daily.  Baseline immunosuppressive labs were drawn on 07/01/2019.  CBC and CMP were drawn on 09/22/2019.  She will get the lab work 1 month after starting on Humira than every 3 months. Standing orders for CBC and CMP are in place.  She is not planning on receiving  the covid-19 vaccination.  She was given a handout of information about the vaccine and recommendations.    Chondromalacia patellae, left knee: Left knee crepitus noted.  She continues to have pain in the left knee joint.  No warmth or effusion was noted on exam today.  Chondromalacia patellae, right knee: She has good range of motion of the right knee joint on exam.  No warmth or effusion was noted.  She has right knee crepitus.  She continues to have chronic pain in both knee joints and has difficulty climbing stairs and rising from a seated position.  Trochanteric bursitis of right hip: She has ongoing tenderness to palpation on exam.  She has painful ROM of both hips on exam.   Arthropathy of lumbar facet joint: She experiences intermittent discomfort in her lower back.  She has midline spinal tenderness in the lumbar region.  She has no symptoms of radiculopathy at this time.  She continues to take Flexeril 10 mg twice daily as needed for muscle spasms.  Vitamin D deficiency: She is taking vitamin D 50,000 units 1 capsule by mouth once weekly.  Abnormal SPEP: Referred to Hematology in the past. Future lab orders placed by Dr. Myna Hidalgo.   Chronic pain syndrome: She is taking Tylenol as needed for pain relief.  We discussed that if she continues to have severe generalized pain we can refer her to pain management.  Family history of rheumatoid arthritis  Orders: No orders of the defined types were placed in this encounter.  No orders of the defined types were placed in this encounter.   Face-to-face time spent with patient was 30  minutes. Greater than 50% of time was spent in counseling and coordination of care.  Follow-Up  Instructions: Return in about 6 weeks (around 12/29/2019) for Rheumatoid arthritis.   Gearldine Bienenstock, PA-C  Note - This record has been created using Dragon software.  Chart creation errors have been sought, but may not always  have been located. Such creation errors do not reflect on  the standard of medical care.

## 2019-11-15 NOTE — Telephone Encounter (Signed)
Patient states she is having pain in her lower back and bilateral knees. Patient states she is having swelling in both knees. Patient states she does not recall any injury. Patient states it has been going on for 3-4 days. Patient states she is taking tylenol arthritis. Patient states it did not give her any refill. Patient is on Enbrel and MTX. Patient states she is taking them as prescribed. Patient had her last injections were on 11/11/2019. Please advise.

## 2019-11-15 NOTE — Telephone Encounter (Signed)
Please call the patient to clarify if she is still taking prednisone 5 mg daily?  Please schedule sooner office visit to assess her joint pain and inflammation.  It does not appear that MTX and Enbrel will be effective at controlling her RA.  We will need to discuss other treatment options.

## 2019-11-15 NOTE — Telephone Encounter (Signed)
Patient called stating she is experiencing pain all over and requesting prescription of Prednisone to be sent to [facility] in [facility].

## 2019-11-15 NOTE — Telephone Encounter (Signed)
Patient states she is taking Prednisone 5 mg daily. Patient has been scheduled for a visit to discuss treatment options on 11/17/2019. Patient needs refill on Prednisone.   Last Visit: 09/22/19 Next Visit: 11/17/19  Okay to refill per Dr. Corliss Skains

## 2019-11-17 ENCOUNTER — Other Ambulatory Visit: Payer: Self-pay

## 2019-11-17 ENCOUNTER — Encounter: Payer: Self-pay | Admitting: Physician Assistant

## 2019-11-17 ENCOUNTER — Telehealth: Payer: Self-pay | Admitting: Pharmacist

## 2019-11-17 ENCOUNTER — Ambulatory Visit (INDEPENDENT_AMBULATORY_CARE_PROVIDER_SITE_OTHER): Payer: 59 | Admitting: Physician Assistant

## 2019-11-17 VITALS — BP 157/108 | HR 91 | Resp 16 | Ht 65.0 in | Wt 221.0 lb

## 2019-11-17 DIAGNOSIS — Z79899 Other long term (current) drug therapy: Secondary | ICD-10-CM

## 2019-11-17 DIAGNOSIS — M2242 Chondromalacia patellae, left knee: Secondary | ICD-10-CM | POA: Diagnosis not present

## 2019-11-17 DIAGNOSIS — M0579 Rheumatoid arthritis with rheumatoid factor of multiple sites without organ or systems involvement: Secondary | ICD-10-CM

## 2019-11-17 DIAGNOSIS — M7061 Trochanteric bursitis, right hip: Secondary | ICD-10-CM

## 2019-11-17 DIAGNOSIS — E559 Vitamin D deficiency, unspecified: Secondary | ICD-10-CM

## 2019-11-17 DIAGNOSIS — M2241 Chondromalacia patellae, right knee: Secondary | ICD-10-CM | POA: Diagnosis not present

## 2019-11-17 DIAGNOSIS — Z8261 Family history of arthritis: Secondary | ICD-10-CM

## 2019-11-17 DIAGNOSIS — G894 Chronic pain syndrome: Secondary | ICD-10-CM

## 2019-11-17 DIAGNOSIS — H5713 Ocular pain, bilateral: Secondary | ICD-10-CM

## 2019-11-17 DIAGNOSIS — M47816 Spondylosis without myelopathy or radiculopathy, lumbar region: Secondary | ICD-10-CM

## 2019-11-17 DIAGNOSIS — R778 Other specified abnormalities of plasma proteins: Secondary | ICD-10-CM

## 2019-11-17 NOTE — Patient Instructions (Addendum)
Standing Labs We placed an order today for your standing lab work.   Please have your standing labs drawn in 1 month then every 3 months   If possible, please have your labs drawn 2 weeks prior to your appointment so that the provider can discuss your results at your appointment.  We have open lab daily Monday through Thursday from 8:30-12:30 PM and 1:30-4:30 PM and Friday from 8:30-12:30 PM and 1:30-4:00 PM at the office of Dr. Pollyann Savoy, [facility].   Please be advised, patients with office appointments requiring lab work will take precedents over walk-in lab work.  If possible, please come for your lab work on Monday and Friday afternoons, as you may experience shorter wait times. The office is located at [address], [place], [place] [address] No appointment is necessary.   Labs are drawn by [facility]. Please bring your co-pay at the time of your lab draw.  You may receive a bill from [facility] for your lab work.  If you wish to have your labs drawn at another location, please call the office 24 hours in advance to send orders.  If you have any questions regarding directions or hours of operation,  please call [phone].   As a reminder, please drink plenty of water prior to coming for your lab work. Thanks!     COVID-19 vaccine recommendations:   COVID-19 vaccine is recommended for everyone (unless you are allergic to a vaccine component), even if you are on a medication that suppresses your immune system.   If you are on Methotrexate, Cellcept (mycophenolate), Rinvoq, Harriette Ohara, and Olumiant- hold the medication for 1 week after each vaccine. Hold Methotrexate for 2 weeks after the single dose COVID-19 vaccine.   If you are on Orencia subcutaneous injection - hold medication one week prior to and one week after the first COVID-19 vaccine dose (only).   If you are on Orencia IV infusions- time vaccination administration so that the first COVID-19  vaccination will occur four weeks after the infusion and postpone the subsequent infusion by one week.   If you are on Cyclophosphamide or Rituxan infusions please contact your doctor prior to receiving the COVID-19 vaccine.   Do not take Tylenol or any anti-inflammatory medications (NSAIDs) 24 hours prior to the COVID-19 vaccination.   There is no direct evidence about the efficacy of the COVID-19 vaccine in individuals who are on medications that suppress the immune system.   Even if you are fully vaccinated, and you are on any medications that suppress your immune system, please continue to wear a mask, maintain at least six feet social distance and practice hand hygiene.   If you develop a COVID-19 infection, please contact your PCP or our office to determine if you need antibody infusion.  The booster vaccine is now available for immunocompromised patients. It is advised that if you had Pfizer vaccine you should get [employer] booster.  If you had a Moderna vaccine then you should get a Moderna booster. Johnson and Laural Benes does not have a booster vaccine at this time.  Please see the following web sites for updated information.   https://www.rheumatology.org/Portals/0/Files/COVID-19-Vaccination-Patient-Resources.pdf  https://www.rheumatology.org/About-Us/Newsroom/Press-Releases/ID/1159   Adalimumab Injection What is this medicine? ADALIMUMAB (a dal AYE mu mab) is used to treat rheumatoid and psoriatic arthritis. It is also used to treat ankylosing spondylitis, Crohn's disease, ulcerative colitis, plaque psoriasis, hidradenitis suppurativa, and uveitis. This medicine may be used for other purposes; ask your health care provider or pharmacist if  you have questions. COMMON BRAND NAME(S): CYLTEZO, Humira What should I tell my health care provider before I take this medicine? They need to know if you have any of these conditions:  diabetes  heart disease  hepatitis B or history of  hepatitis B infection  immune system problems  infection or history of infections  multiple sclerosis  recently received or scheduled to receive a vaccine  scheduled to have surgery  tuberculosis, a positive skin test for tuberculosis or have recently been in close contact with someone who has tuberculosis  an unusual reaction to adalimumab, other medicines, mannitol, latex, rubber, foods, dyes, or preservatives  pregnant or trying to get pregnant  breast-feeding How should I use this medicine? This medicine is for injection under the skin. You will be taught how to prepare and give this medicine. Use exactly as directed. Take your medicine at regular intervals. Do not take your medicine more often than directed. A special MedGuide will be given to you by the pharmacist with each prescription and refill. Be sure to read this information carefully each time. It is important that you put your used needles and syringes in a special sharps container. Do not put them in a trash can. If you do not have a sharps container, call your pharmacist or healthcare provider to get one. Talk to your pediatrician regarding the use of this medicine in children. While this drug may be prescribed for children as young as 2 years for selected conditions, precautions do apply. The manufacturer of the medicine offers free information to patients and their health care partners. Call [phone] for more information. Overdosage: If you think you have taken too much of this medicine contact a poison control center or emergency room at once. NOTE: This medicine is only for you. Do not share this medicine with others. What if I miss a dose? If you miss a dose, take it as soon as you can. If it is almost time for your next dose, take only that dose. Do not take double or extra doses. Give the next dose when your next scheduled dose is due. Call your doctor or health care professional if you are not sure how to  handle a missed dose. What may interact with this medicine? Do not take this medicine with any of the following medications:  abatacept  anakinra  etanercept  infliximab  live virus vaccines  rilonacept This medicine may also interact with the following medications:  vaccines This list may not describe all possible interactions. Give your health care provider a list of all the medicines, herbs, non-prescription drugs, or dietary supplements you use. Also tell them if you smoke, drink alcohol, or use illegal drugs. Some items may interact with your medicine. What should I watch for while using this medicine? Visit your doctor or health care professional for regular checks on your progress. Tell your doctor or healthcare professional if your symptoms do not start to get better or if they get worse. You will be tested for tuberculosis (TB) before you start this medicine. If your doctor prescribes any medicine for TB, you should start taking the TB medicine before starting this medicine. Make sure to finish the full course of TB medicine. Call your doctor or health care professional if you get a cold or other infection while receiving this medicine. Do not treat yourself. This medicine may decrease your body's ability to fight infection. Talk to your doctor about your risk of cancer. You may be more  at risk for certain types of cancers if you take this medicine. What side effects may I notice from receiving this medicine? Side effects that you should report to your doctor or health care professional as soon as possible:  allergic reactions like skin rash, itching or hives, swelling of the face, lips, or tongue  breathing problems  changes in vision  chest pain  fever, chills, or any other sign of infection  numbness or tingling  red, scaly patches or raised bumps on the skin  swelling of the ankles  swollen lymph nodes in the neck, underarm, or groin areas  unexplained weight  loss  unusual bleeding or bruising  unusually weak or tired Side effects that usually do not require medical attention (report to your doctor or health care professional if they continue or are bothersome):  headache  nausea  redness, itching, swelling, or bruising at site where injected This list may not describe all possible side effects. Call your doctor for medical advice about side effects. You may report side effects to FDA at 1-800-FDA-1088. Where should I keep my medicine? Keep out of the reach of children. Store in the original container and in the refrigerator between 2 and 8 degrees C (36 and 46 degrees F). Do not freeze. The product may be stored in a cool carrier with an ice pack, if needed. Protect from light. Throw away any unused medicine after the expiration date. NOTE: This sheet is a summary. It may not cover all possible information. If you have questions about this medicine, talk to your doctor, pharmacist, or health care provider.  2020 Elsevier/Gold Standard (2017-12-22 13:22:46)

## 2019-11-17 NOTE — Telephone Encounter (Signed)
Please start benefits investigation for Humira for treatment of rheumatoid arthritis.  She has tried MTX monotherapy, MTX with Enbrel and prednisone with inadequate response.   Verlin Fester, PharmD, [place], CPP Clinical Specialty Pharmacist (Rheumatology and Pulmonology)  11/17/2019 11:46 AM

## 2019-11-17 NOTE — Progress Notes (Signed)
Pharmacy Note Subjective: Patient presents today to [facility] for follow up office visit. Patient seen by the pharmacist for counseling on Humira for rheumatoid arthritis.  She has tried MTX monotherapy, MTX with Enbrel and prednisone with inadequate response.   Objective:  CBC    Component Value Date/Time   WBC 7.4 09/22/2019 1154   RBC 5.33 (H) 09/22/2019 1154   HGB 11.2 (L) 09/22/2019 1154   HCT 37.0 09/22/2019 1154   PLT 420 (H) 09/22/2019 1154   MCV 69.4 (L) 09/22/2019 1154   MCH 21.0 (L) 09/22/2019 1154   MCHC 30.3 (L) 09/22/2019 1154   RDW 23.0 (H) 09/22/2019 1154   LYMPHSABS 3,189 09/22/2019 1154   MONOABS 0.3 09/14/2014 1640   EOSABS 67 09/22/2019 1154   BASOSABS 59 09/22/2019 1154     CMP     Component Value Date/Time   NA 141 09/22/2019 1154   K 4.3 09/22/2019 1154   CL 105 09/22/2019 1154   CO2 30 09/22/2019 1154   GLUCOSE 77 09/22/2019 1154   BUN 8 09/22/2019 1154   CREATININE 0.78 09/22/2019 1154   CALCIUM 9.5 09/22/2019 1154   PROT 6.9 09/22/2019 1154   ALBUMIN 3.8 07/01/2011 1832   AST 12 09/22/2019 1154   ALT 9 09/22/2019 1154   ALKPHOS 61 07/01/2011 1832   BILITOT 0.3 09/22/2019 1154   GFRNONAA 89 09/22/2019 1154   GFRAA 103 09/22/2019 1154      Baseline Immunosuppressant Therapy Labs TB GOLD Quantiferon TB Gold Latest Ref Rng & Units 07/01/2019  Quantiferon TB Gold Plus NEGATIVE NEGATIVE   Hepatitis Panel Hepatitis Latest Ref Rng & Units 07/01/2019  Hep B Surface Ag NON-REACTI NON-REACTIVE  Hep B IgM NON-REACTI NON-REACTIVE  Hep C Ab NON-REACTI NON-REACTIVE  Hep C Ab NON-REACTI NON-REACTIVE   HIV Lab Results  Component Value Date   HIV NON-REACTIVE 07/01/2019   Immunoglobulins Immunoglobulin Electrophoresis Latest Ref Rng & Units 07/01/2019  IgA  47 - 310 mg/dL 671  IgG 245 - 8,099 mg/dL 8,338  IgM 50 - 250 mg/dL 539   SPEP Serum Protein Electrophoresis Latest Ref Rng & Units 09/22/2019  Total Protein 6.1 - 8.1 g/dL  6.9  Albumin 3.8 - 4.8 g/dL -  Alpha-1 0.2 - 0.3 g/dL -  Alpha-2 0.5 - 0.9 g/dL -  Beta Globulin 0.4 - 0.6 g/dL -  Beta 2 0.2 - 0.5 g/dL -  Gamma Globulin 0.8 - 1.7 g/dL -   J6BH Lab Results  Component Value Date   G6PDH 28.9 (H) 07/01/2019   TPMT No results found for: TPMT   Chest x-ray: no acute disease  Does patient have diagnosis of heart failure?  No  Assessment/Plan:  Counseled patient that Humira is a TNF blocking agent.  Counseled patient on purpose, proper use, and adverse effects of Humira.  Reviewed the most common adverse effects including infections, headache, and injection site reactions. Discussed that there is the possibility of an increased risk of malignancy but it is not well understood if this increased risk is due to the medication or the disease state.  Advised patient to get yearly dermatology exams due to risk of skin cancer. Counseled patient that Humira should be held prior to scheduled surgery.  Counseled patient to avoid live vaccines while on Humira.  Advised patient to get annual influenza vaccine and the pneumococcal vaccine as indicated.    Reviewed the importance of regular labs while on Humira therapy.  Standing orders placed.  Provided patient with  medication education material and answered all questions.  Patient consented to Humira.  Will upload consent into the media tab.  Reviewed storage instructions of Humira.  Advised initial injection must be administered in office.  Patient verbalized understanding.  Dose will be for rheumatoid arthritis Humira 40 mg every 14 days.  Prescription pending lab results and/or insurance approval.  Verlin Fester, PharmD, BCACP, CPP Clinical Specialty Pharmacist (Rheumatology and Pulmonology)  11/17/2019 11:44 AM

## 2019-11-17 NOTE — Telephone Encounter (Signed)
Submitted a Prior Authorization request to [facility] for HUMIRA via Cover My Meds. Will update once we receive a response.   [id number] - PA Case ID: [id number]

## 2019-11-19 NOTE — Telephone Encounter (Signed)
Received notification from [facility] regarding a prior authorization for HUMIRA. Authorization has been APPROVED from 11/19/19 to 02/17/20.   Authorization # [id number]  Per plan, patient must fill through [facility]. Patient has commercial plan and is able to use copay card.  Card: [id number]  Issued: 11/19/2019  Rx GROUP: [id number]  Rx BIN: [id number]  Rx PCN: [id number]  Suf: 01

## 2019-11-19 NOTE — Telephone Encounter (Signed)
Patient approved for Humira.  Please schedule new start appointment.  Patient will need to be off Enbrel for 1 week prior to appointment.

## 2019-11-24 ENCOUNTER — Ambulatory Visit: Payer: 59 | Admitting: Physician Assistant

## 2019-11-29 NOTE — Progress Notes (Signed)
Pharmacy Note  Subjective:   Patient presents to clinic today to receive first dose of Humira.  Patient running a fever or have signs/symptoms of infection? No  Patient currently on antibiotics for the treatment of infection? No  Patient have any upcoming invasive procedures/surgeries? No  Objective: CMP     Component Value Date/Time   NA 141 09/22/2019 1154   K 4.3 09/22/2019 1154   CL 105 09/22/2019 1154   CO2 30 09/22/2019 1154   GLUCOSE 77 09/22/2019 1154   BUN 8 09/22/2019 1154   CREATININE 0.78 09/22/2019 1154   CALCIUM 9.5 09/22/2019 1154   PROT 6.9 09/22/2019 1154   ALBUMIN 3.8 07/01/2011 1832   AST 12 09/22/2019 1154   ALT 9 09/22/2019 1154   ALKPHOS 61 07/01/2011 1832   BILITOT 0.3 09/22/2019 1154   GFRNONAA 89 09/22/2019 1154   GFRAA 103 09/22/2019 1154    CBC    Component Value Date/Time   WBC 7.4 09/22/2019 1154   RBC 5.33 (H) 09/22/2019 1154   HGB 11.2 (L) 09/22/2019 1154   HCT 37.0 09/22/2019 1154   PLT 420 (H) 09/22/2019 1154   MCV 69.4 (L) 09/22/2019 1154   MCH 21.0 (L) 09/22/2019 1154   MCHC 30.3 (L) 09/22/2019 1154   RDW 23.0 (H) 09/22/2019 1154   LYMPHSABS 3,189 09/22/2019 1154   MONOABS 0.3 09/14/2014 1640   EOSABS 67 09/22/2019 1154   BASOSABS 59 09/22/2019 1154    Baseline Immunosuppressant Therapy Labs TB GOLD Quantiferon TB Gold Latest Ref Rng & Units 07/01/2019  Quantiferon TB Gold Plus NEGATIVE NEGATIVE   Hepatitis Panel Hepatitis Latest Ref Rng & Units 07/01/2019  Hep B Surface Ag NON-REACTI NON-REACTIVE  Hep B IgM NON-REACTI NON-REACTIVE  Hep C Ab NON-REACTI NON-REACTIVE  Hep C Ab NON-REACTI NON-REACTIVE   HIV Lab Results  Component Value Date   HIV NON-REACTIVE 07/01/2019   Immunoglobulins Immunoglobulin Electrophoresis Latest Ref Rng & Units 07/01/2019  IgA  47 - 310 mg/dL 993  IgG 716 - 9,678 mg/dL 9,381  IgM 50 - 017 mg/dL 510   SPEP Serum Protein Electrophoresis Latest Ref Rng & Units 09/22/2019  Total Protein  6.1 - 8.1 g/dL 6.9  Albumin 3.8 - 4.8 g/dL -  Alpha-1 0.2 - 0.3 g/dL -  Alpha-2 0.5 - 0.9 g/dL -  Beta Globulin 0.4 - 0.6 g/dL -  Beta 2 0.2 - 0.5 g/dL -  Gamma Globulin 0.8 - 1.7 g/dL -   C5EN Lab Results  Component Value Date   G6PDH 28.9 (H) 07/01/2019   TPMT No results found for: TPMT   Chest x-ray: no acute disease 07/01/2011  Assessment/Plan:  Demonstrated proper injection technique with Humira demo pen.  Patient able to demonstrate proper injection technique using the teach back method. Patient self injected in the lower abdominal tissue with:  Sample Medication: Humira 40mg /ml NDC:  Lot: [id number] Expiration: 09/2020  Patient tolerated well.  Observed for 30 mins in office for adverse reaction and none noted.   Patient is to return in 1 month follow-up and for labs.  Standing orders placed. Prescription sent to [facility] pharmacy required per insurance.  Patient already enrolled in co-pay card program.  Patient states she is having issues injecting vial and syringe methotrexate.  She has joint tenderness in almost all of the joints in her hands and joint swelling and tenderness in her left wrist which makes it difficult to draw out the correct dose of methotrexate.  She also states  that the injection is painful.  We have applied for Rasuvo in the past is not covered through insurance.  We will submit for pen device approval along with documentation of difficulty using vial and syringe.  In the meantime sent in a new prescription of syringes with shorter needle length.  All questions encouraged and answered.  Instructed patient to call with any further questions or concerns.  Verlin Fester, PharmD, [place], CPP Clinical Specialty Pharmacist (Rheumatology and Pulmonology)  11/30/2019 11:05 AM

## 2019-11-30 ENCOUNTER — Other Ambulatory Visit: Payer: Self-pay

## 2019-11-30 ENCOUNTER — Telehealth: Payer: Self-pay | Admitting: Pharmacist

## 2019-11-30 ENCOUNTER — Ambulatory Visit (INDEPENDENT_AMBULATORY_CARE_PROVIDER_SITE_OTHER): Payer: 59 | Admitting: Pharmacist

## 2019-11-30 VITALS — BP 152/99 | HR 80

## 2019-11-30 DIAGNOSIS — M0579 Rheumatoid arthritis with rheumatoid factor of multiple sites without organ or systems involvement: Secondary | ICD-10-CM

## 2019-11-30 MED ORDER — HUMIRA (2 PEN) 40 MG/0.4ML ~~LOC~~ AJKT: 40.0000 mg | AUTO-INJECTOR | SUBCUTANEOUS | 0 refills | Status: DC

## 2019-11-30 MED ORDER — "INSULIN SYRINGE-NEEDLE U-100 30G X 5/16"" 1 ML MISC": 3 refills | Status: AC

## 2019-11-30 NOTE — Patient Instructions (Signed)
Remember the 5 C's:  COUNTER- leave on the counter at least 30 mins but up to overnight to bring medication to room temperature and prevent stinging  COLD- Placing something cold (like and ice gel pack or cold water bottle) on the injection site just before cleansing with alcohol may help reduce pain  CLARITIN- for the first two weeks of treatment or  the day of, the day before, and the day after injecting to minimize injection site reactions  CORTISONE CREAM- apply if injection site is irritated and itching  CALL ME- if injection site reaction is bigger than the size of your fist, looks infected, blisters, or develop hives  Standing Labs We placed an order today for your standing lab work.   Please have your standing labs drawn in 1 month and then every 3 months.  If possible, please have your labs drawn 2 weeks prior to your appointment so that the provider can discuss your results at your appointment.  We have open lab daily Monday through Thursday from 8:30-12:30 PM and 1:30-4:30 PM and Friday from 8:30-12:30 PM and 1:30-4:00 PM at the office of Dr. Shaili Deveshwar, [facility].   Please be advised, patients with office appointments requiring lab work will take precedents over walk-in lab work.  If possible, please come for your lab work on Monday and Friday afternoons, as you may experience shorter wait times. The office is located at [address], [address], [place], [place] [address] No appointment is necessary.   Labs are drawn by [facility]. Please bring your co-pay at the time of your lab draw.  You may receive a bill from [facility] for your lab work.  If you wish to have your labs drawn at another location, please call the office 24 hours in advance to send orders.  If you have any questions regarding directions or hours of operation,  please call [phone].   As a reminder, please drink plenty of water prior to coming for your lab work. Thanks!  

## 2019-11-30 NOTE — Telephone Encounter (Signed)
Submitted a Prior Authorization request to [facility] for [id number] via Cover My Meds. Will update once we receive a response.   (KeyJunie Panning) - [id number]

## 2019-11-30 NOTE — Telephone Encounter (Signed)
Patient reports difficulty using methotrexate vial and syringe.  Rasuvo was denied through her insurance in the past.  Please submit for Rasuvo approval through insurance with documentation of difficulty using vial and syringe as well as joint exam at last visit on 11/17/2019.  Verlin Fester, PharmD, [place], CPP Clinical Specialty Pharmacist (Rheumatology and Pulmonology)  11/30/2019 11:08 AM

## 2019-12-03 NOTE — Telephone Encounter (Signed)
Faxed plan chart notes for PA request. Awaiting response.

## 2019-12-14 ENCOUNTER — Telehealth: Payer: Self-pay | Admitting: Rheumatology

## 2019-12-14 NOTE — Telephone Encounter (Signed)
Patient advised we do not have any samples to provide for Humira. Patient advised her prescription was sent to the pharmacy on 11/30/2019. Patient advised to contact the pharmacy and see if they may be able to expedite the prescription to her. Patient has number for the pharmacy and will call them to set up shipment.

## 2019-12-14 NOTE — Telephone Encounter (Signed)
Patient has not received Humira as of yet, not even a call for pharmacy. Patient is due for next dose on Thursday. Patient needs to pick up a sample. Please advise. Has rx been sent to pharmacy?

## 2019-12-15 ENCOUNTER — Encounter: Payer: Self-pay | Admitting: Pharmacist

## 2019-12-15 NOTE — Progress Notes (Deleted)
Office Visit Note  Patient: Jeanette Potts             Date of Birth: 1970-12-01           MRN: [id number]             PCP: Laruth Bouchard, MD Referring: Laruth Bouchard, MD Visit Date: 12/29/2019 Occupation: @GUAROCC @  Subjective:  No chief complaint on file.   History of Present Illness: Jeanette Potts is a 49 y.o. female ***   Activities of Daily Living:  Patient reports morning stiffness for *** {minute/hour:19697}.   Patient {ACTIONS;DENIES/REPORTS:21021675::"Denies"} nocturnal pain.  Difficulty dressing/grooming: {ACTIONS;DENIES/REPORTS:21021675::"Denies"} Difficulty climbing stairs: {ACTIONS;DENIES/REPORTS:21021675::"Denies"} Difficulty getting out of chair: {ACTIONS;DENIES/REPORTS:21021675::"Denies"} Difficulty using hands for taps, buttons, cutlery, and/or writing: {ACTIONS;DENIES/REPORTS:21021675::"Denies"}  No Rheumatology ROS completed.   PMFS History:  Patient Active Problem List   Diagnosis Date Noted  . Chronic pain syndrome 07/01/2019  . Anxiety and depression 07/01/2019  . History of gastroesophageal reflux (GERD) 07/01/2019  . History of type 2 diabetes mellitus 07/01/2019  . Essential hypertension 07/01/2019  . Elevated cholesterol 07/01/2019    Past Medical History:  Diagnosis Date  . Diabetes mellitus   . Hypertension   . Rheumatoid arthritis (HCC)     Family History  Problem Relation Age of Onset  . Diabetes Mother   . Hypertension Mother   . Rheum arthritis Mother   . Heart attack Father   . Hypertension Brother    No past surgical history on file. Social History   Social History Narrative  . Not on file    There is no immunization history on file for this patient.   Objective: Vital Signs: There were no vitals taken for this visit.   Physical Exam   Musculoskeletal Exam: ***  CDAI Exam: CDAI Score: -- Patient Global: --; Provider Global: -- Swollen: --; Tender: -- Joint Exam 12/29/2019   No joint exam has been documented  for this visit   There is currently no information documented on the homunculus. Go to the Rheumatology activity and complete the homunculus joint exam.  Investigation: No additional findings.  Imaging: No results found.  Recent Labs: Lab Results  Component Value Date   WBC 7.4 09/22/2019   HGB 11.2 (L) 09/22/2019   PLT 420 (H) 09/22/2019   NA 141 09/22/2019   K 4.3 09/22/2019   CL 105 09/22/2019   CO2 30 09/22/2019   GLUCOSE 77 09/22/2019   BUN 8 09/22/2019   CREATININE 0.78 09/22/2019   BILITOT 0.3 09/22/2019   ALKPHOS 61 07/01/2011   AST 12 09/22/2019   ALT 9 09/22/2019   PROT 6.9 09/22/2019   ALBUMIN 3.8 07/01/2011   CALCIUM 9.5 09/22/2019   GFRAA 103 09/22/2019   QFTBGOLDPLUS NEGATIVE 07/01/2019    Speciality Comments: No specialty comments available.  Procedures:  No procedures performed Allergies: Patient has no known allergies.   Assessment / Plan:     Visit Diagnoses: No diagnosis found.  Orders: No orders of the defined types were placed in this encounter.  No orders of the defined types were placed in this encounter.   Face-to-face time spent with patient was *** minutes. Greater than 50% of time was spent in counseling and coordination of care.  Follow-Up Instructions: No follow-ups on file.   07/03/2019, CMA  Note - This record has been created using Ellen Henri.  Chart creation errors have been sought, but may not always  have been located. Such creation errors do not reflect on  the standard of medical care. 

## 2019-12-16 NOTE — Telephone Encounter (Signed)
Patient advised that otrexup and rasvuo were denied by her insurance.  Her insurance requires her to fail oral MTX prior to approving the authorization.   Patient states she would like to remain on vial and syringe MTX. Patient advised Injectable will be more effective and better tolerated than oral MTX.

## 2019-12-16 NOTE — Telephone Encounter (Signed)
Please notify the patient that otrexup and rasvuo were denied by her insurance.  Her insurance requires her to fail oral MTX prior to approving the authorization.   Please clarify if the patient would like to remain on vial and syringe MTX?  Injectable will be more effective and better tolerated than oral MTX.

## 2019-12-16 NOTE — Telephone Encounter (Signed)
Received a fax regarding Prior Authorization from [facility] for OTREXUP. Authorization has been DENIED because patient has not tried or failed oral methotrexate.  Phone#[phone]  Please advise.

## 2019-12-27 NOTE — Progress Notes (Deleted)
Office Visit Note  Patient: Jeanette Potts             Date of Birth: 29-Jul-1970           MRN: [id number]             PCP: Laruth Bouchard, MD Referring: Laruth Bouchard, MD Visit Date: 01/05/2020 Occupation: @GUAROCC @  Subjective:  No chief complaint on file.   History of Present Illness: Jeanette Potts is a 49 y.o. female ***   Activities of Daily Living:  Patient reports morning stiffness for *** {minute/hour:19697}.   Patient {ACTIONS;DENIES/REPORTS:21021675::"Denies"} nocturnal pain.  Difficulty dressing/grooming: {ACTIONS;DENIES/REPORTS:21021675::"Denies"} Difficulty climbing stairs: {ACTIONS;DENIES/REPORTS:21021675::"Denies"} Difficulty getting out of chair: {ACTIONS;DENIES/REPORTS:21021675::"Denies"} Difficulty using hands for taps, buttons, cutlery, and/or writing: {ACTIONS;DENIES/REPORTS:21021675::"Denies"}  No Rheumatology ROS completed.   PMFS History:  Patient Active Problem List   Diagnosis Date Noted  . Chronic pain syndrome 07/01/2019  . Anxiety and depression 07/01/2019  . History of gastroesophageal reflux (GERD) 07/01/2019  . History of type 2 diabetes mellitus 07/01/2019  . Essential hypertension 07/01/2019  . Elevated cholesterol 07/01/2019    Past Medical History:  Diagnosis Date  . Diabetes mellitus   . Hypertension   . Rheumatoid arthritis (HCC)     Family History  Problem Relation Age of Onset  . Diabetes Mother   . Hypertension Mother   . Rheum arthritis Mother   . Heart attack Father   . Hypertension Brother    No past surgical history on file. Social History   Social History Narrative  . Not on file    There is no immunization history on file for this patient.   Objective: Vital Signs: There were no vitals taken for this visit.   Physical Exam   Musculoskeletal Exam: ***  CDAI Exam: CDAI Score: -- Patient Global: --; Provider Global: -- Swollen: --; Tender: -- Joint Exam 01/05/2020   No joint exam has been documented  for this visit   There is currently no information documented on the homunculus. Go to the Rheumatology activity and complete the homunculus joint exam.  Investigation: No additional findings.  Imaging: No results found.  Recent Labs: Lab Results  Component Value Date   WBC 7.4 09/22/2019   HGB 11.2 (L) 09/22/2019   PLT 420 (H) 09/22/2019   NA 141 09/22/2019   K 4.3 09/22/2019   CL 105 09/22/2019   CO2 30 09/22/2019   GLUCOSE 77 09/22/2019   BUN 8 09/22/2019   CREATININE 0.78 09/22/2019   BILITOT 0.3 09/22/2019   ALKPHOS 61 07/01/2011   AST 12 09/22/2019   ALT 9 09/22/2019   PROT 6.9 09/22/2019   ALBUMIN 3.8 07/01/2011   CALCIUM 9.5 09/22/2019   GFRAA 103 09/22/2019   QFTBGOLDPLUS NEGATIVE 07/01/2019    Speciality Comments: No specialty comments available.  Procedures:  No procedures performed Allergies: Patient has no known allergies.   Assessment / Plan:     Visit Diagnoses: Rheumatoid arthritis involving multiple sites with positive rheumatoid factor (HCC)  High risk medication use  Chondromalacia patellae, left knee  Chondromalacia patellae, right knee  Trochanteric bursitis of right hip  Arthropathy of lumbar facet joint  Vitamin D deficiency  Abnormal SPEP  Chronic pain syndrome  Family history of rheumatoid arthritis  Anxiety and depression  Essential hypertension  Elevated cholesterol  History of gastroesophageal reflux (GERD)  History of type 2 diabetes mellitus  Orders: No orders of the defined types were placed in this encounter.  No orders of the defined types  were placed in this encounter.   Face-to-face time spent with patient was *** minutes. Greater than 50% of time was spent in counseling and coordination of care.  Follow-Up Instructions: No follow-ups on file.   Gearldine Bienenstock, PA-C  Note - This record has been created using Dragon software.  Chart creation errors have been sought, but may not always  have been  located. Such creation errors do not reflect on  the standard of medical care.

## 2019-12-29 ENCOUNTER — Ambulatory Visit: Payer: 59 | Admitting: Physician Assistant

## 2020-01-03 ENCOUNTER — Encounter: Payer: Self-pay | Admitting: Rheumatology

## 2020-01-03 ENCOUNTER — Other Ambulatory Visit: Payer: Self-pay

## 2020-01-03 ENCOUNTER — Ambulatory Visit (INDEPENDENT_AMBULATORY_CARE_PROVIDER_SITE_OTHER): Payer: 59 | Admitting: Rheumatology

## 2020-01-03 VITALS — BP 148/104 | HR 81 | Resp 17 | Ht 65.0 in | Wt 223.2 lb

## 2020-01-03 DIAGNOSIS — M7061 Trochanteric bursitis, right hip: Secondary | ICD-10-CM

## 2020-01-03 DIAGNOSIS — M2241 Chondromalacia patellae, right knee: Secondary | ICD-10-CM | POA: Diagnosis not present

## 2020-01-03 DIAGNOSIS — M47816 Spondylosis without myelopathy or radiculopathy, lumbar region: Secondary | ICD-10-CM

## 2020-01-03 DIAGNOSIS — Z8639 Personal history of other endocrine, nutritional and metabolic disease: Secondary | ICD-10-CM

## 2020-01-03 DIAGNOSIS — Z8261 Family history of arthritis: Secondary | ICD-10-CM

## 2020-01-03 DIAGNOSIS — E559 Vitamin D deficiency, unspecified: Secondary | ICD-10-CM

## 2020-01-03 DIAGNOSIS — M2242 Chondromalacia patellae, left knee: Secondary | ICD-10-CM

## 2020-01-03 DIAGNOSIS — Z7189 Other specified counseling: Secondary | ICD-10-CM

## 2020-01-03 DIAGNOSIS — F32A Depression, unspecified: Secondary | ICD-10-CM

## 2020-01-03 DIAGNOSIS — R778 Other specified abnormalities of plasma proteins: Secondary | ICD-10-CM

## 2020-01-03 DIAGNOSIS — Z79899 Other long term (current) drug therapy: Secondary | ICD-10-CM

## 2020-01-03 DIAGNOSIS — M0579 Rheumatoid arthritis with rheumatoid factor of multiple sites without organ or systems involvement: Secondary | ICD-10-CM | POA: Diagnosis not present

## 2020-01-03 DIAGNOSIS — G894 Chronic pain syndrome: Secondary | ICD-10-CM

## 2020-01-03 DIAGNOSIS — I10 Essential (primary) hypertension: Secondary | ICD-10-CM

## 2020-01-03 DIAGNOSIS — F419 Anxiety disorder, unspecified: Secondary | ICD-10-CM

## 2020-01-03 DIAGNOSIS — E7800 Pure hypercholesterolemia, unspecified: Secondary | ICD-10-CM

## 2020-01-03 DIAGNOSIS — Z8719 Personal history of other diseases of the digestive system: Secondary | ICD-10-CM

## 2020-01-03 NOTE — Patient Instructions (Addendum)
COVID-19 vaccine recommendations:   COVID-19 vaccine is recommended for everyone (unless you are allergic to a vaccine component), even if you are on a medication that suppresses your immune system.   If you are on Methotrexate, Cellcept (mycophenolate), Rinvoq, Harriette Ohara, and Olumiant- hold the medication for 1 week after each vaccine. Hold Methotrexate for 2 weeks after the single dose COVID-19 vaccine.   Do not take Tylenol or any anti-inflammatory medications (NSAIDs) 24 hours prior to the COVID-19 vaccination.   There is no direct evidence about the efficacy of the COVID-19 vaccine in individuals who are on medications that suppress the immune system.   Even if you are fully vaccinated, and you are on any medications that suppress your immune system, please continue to wear a mask, maintain at least six feet social distance and practice hand hygiene.   If you develop a COVID-19 infection, please contact your PCP or our office to determine if you need antibody infusion.  The booster vaccine is now available for immunocompromised patients. It is advised that if you had Pfizer vaccine you should get [employer] booster.  If you had a Moderna vaccine then you should get a Moderna booster. Johnson and Laural [employer]es does not have a booster vaccine at this time.  Please see the following web sites for updated information.   [email or web address]  Standing Labs We placed an order today for your standing lab work.   Please have your standing labs drawn in January and every 3 months  If possible, please have your labs drawn 2 weeks prior to your appointment so that the provider can discuss your results at your appointment.  We have open lab daily Monday through Thursday from 8:30-12:30 PM and 1:30-4:30 PM and Friday from 8:30-12:30 PM and 1:30-4:00 PM at the office of Dr. Pollyann Savoy, [facility].   Please be advised,  patients with office appointments requiring lab work will take precedents over walk-in lab work.  If possible, please come for your lab work on Monday and Friday afternoons, as you may experience shorter wait times. The office is located at [address], [address], [place], [place] [address] No appointment is necessary.   Labs are drawn by [facility]. Please bring your co-pay at the time of your lab draw.  You may receive a bill from [facility] for your lab work.  If you wish to have your labs drawn at another location, please call the office 24 hours in advance to send orders.  If you have any questions regarding directions or hours of operation,  please call [phone].   As a reminder, please drink plenty of water prior to coming for your lab work. Thanks!   Heart Disease Prevention   Your inflammatory disease increases your risk of heart disease which includes heart attack, stroke, atrial fibrillation (irregular heartbeats), high blood pressure, heart failure and atherosclerosis (plaque in the arteries).  It is important to reduce your risk by:   Keep blood pressure, cholesterol, and blood sugar at healthy levels   Smoking Cessation   Maintain a healthy weight   BMI 20-25   Eat a healthy diet   Plenty of fresh fruit, vegetables, and whole grains   Limit saturated fats, foods high in sodium, and added sugars   DASH and Mediterranean diet   Increase physical activity   Recommend moderate physically activity for 150 minutes per week/ 30 minutes a day for five days a week These can be broken up into three separate ten-minute sessions during the  day.   Reduce Stress   Meditation, slow breathing exercises, yoga, coloring books   Dental visits twice a year

## 2020-01-03 NOTE — Progress Notes (Signed)
Office Visit Note  Patient: Jeanette Potts             Date of Birth: 1971-03-12           MRN: [id number]             PCP: Laruth Bouchard, MD Referring: Laruth Bouchard, MD Visit Date: 01/03/2020 Occupation: @GUAROCC @  Subjective:  Pain in multiple joints.   History of Present Illness: Jeanette Potts is a 49 y.o. female with seropositive rheumatoid arthritis and osteoarthritis.  She states she has been experiencing pain in her left wrist, bilateral knee joints and her right foot.  She states her lower back pain has been persistent.  She has been on Humira 40 mg subcu every other week, methotrexate 0.8 mL subcu weekly, folic acid.  She was switched to Humira about a month ago from Enbrel due to ongoing joint pain and inflammation.  She states she has been having a lot of lower back pain which causes discomfort.  That is her main issue currently.  Activities of Daily Living:  Patient reports morning stiffness for all day.  Patient Reports nocturnal pain.  Difficulty dressing/grooming: Denies Difficulty climbing stairs: Reports Difficulty getting out of chair: Reports Difficulty using hands for taps, buttons, cutlery, and/or writing: Reports  Review of Systems  Constitutional: Positive for fatigue.  HENT: Negative for mouth sores, mouth dryness and nose dryness.   Eyes: Negative for pain, itching and dryness.  Respiratory: Negative for shortness of breath and difficulty breathing.   Cardiovascular: Negative for chest pain and palpitations.  Gastrointestinal: Negative for blood in stool, constipation and diarrhea.  Endocrine: Positive for increased urination.  Genitourinary: Negative for difficulty urinating and painful urination.  Musculoskeletal: Positive for arthralgias, joint pain, joint swelling, myalgias, morning stiffness, muscle tenderness and myalgias.  Skin: Negative for color change, rash and redness.  Allergic/Immunologic: Negative for susceptible to infections.    Neurological: Positive for dizziness, numbness, headaches and weakness. Negative for memory loss.  Hematological: Negative for bruising/bleeding tendency.  Psychiatric/Behavioral: Positive for sleep disturbance. Negative for confusion.    PMFS History:  Patient Active Problem List   Diagnosis Date Noted  . Rheumatoid arthritis involving multiple sites with positive rheumatoid factor (HCC) 01/03/2020  . Trochanteric bursitis of right hip 01/03/2020  . Arthropathy of lumbar facet joint 01/03/2020  . Chronic pain syndrome 07/01/2019  . Anxiety and depression 07/01/2019  . History of gastroesophageal reflux (GERD) 07/01/2019  . History of type 2 diabetes mellitus 07/01/2019  . Essential hypertension 07/01/2019  . Elevated cholesterol 07/01/2019    Past Medical History:  Diagnosis Date  . Diabetes mellitus   . Hypertension   . Rheumatoid arthritis (HCC)     Family History  Problem Relation Age of Onset  . Diabetes Mother   . Hypertension Mother   . Rheum arthritis Mother   . Heart attack Father   . Hypertension Brother    History reviewed. No pertinent surgical history. Social History   Social History Narrative  . Not on file    There is no immunization history on file for this patient.   Objective: Vital Signs: BP (!) 148/104 (BP Location: Left Arm, Patient Position: Sitting, Cuff Size: Normal)   Pulse 81   Resp 17   Ht 5\' 5"  (1.651 m)   Wt 223 lb 3.2 oz (101.2 kg)   BMI 37.14 kg/m    Physical Exam Vitals and nursing note reviewed.  Constitutional:      Appearance: She is well-developed.  HENT:     Head: Normocephalic and atraumatic.  Eyes:     Conjunctiva/sclera: Conjunctivae normal.  Cardiovascular:     Rate and Rhythm: Normal rate and regular rhythm.     Heart sounds: Normal heart sounds.  Pulmonary:     Effort: Pulmonary effort is normal.     Breath sounds: Normal breath sounds.  Abdominal:     General: Bowel sounds are normal.     Palpations:  Abdomen is soft.  Musculoskeletal:     Cervical back: Normal range of motion.  Lymphadenopathy:     Cervical: No cervical adenopathy.  Skin:    General: Skin is warm and dry.     Capillary Refill: Capillary refill takes less than 2 seconds.  Neurological:     Mental Status: She is alert and oriented to person, place, and time.  Psychiatric:        Behavior: Behavior normal.      Musculoskeletal Exam: C-spine was in good range of motion.  She had painful range of motion of the lumbar spine.  Shoulder joints, elbow joints, wrist joints, MCPs PIPs and DIPs with good range of motion with no synovitis.  She has some tenderness on palpation of her left wrist joint.  Hip joints, knee joints, ankles, MTPs and PIPs with good range of motion with no synovitis.  CDAI Exam: CDAI Score: 1.7  Patient Global: 5 mm; Provider Global: 2 mm Swollen: 0 ; Tender: 1  Joint Exam 01/03/2020      Right  Left  Wrist      Tender     Investigation: No additional findings.  Imaging: No results found.  Recent Labs: Lab Results  Component Value Date   WBC 7.4 09/22/2019   HGB 11.2 (L) 09/22/2019   PLT 420 (H) 09/22/2019   NA 141 09/22/2019   K 4.3 09/22/2019   CL 105 09/22/2019   CO2 30 09/22/2019   GLUCOSE 77 09/22/2019   BUN 8 09/22/2019   CREATININE 0.78 09/22/2019   BILITOT 0.3 09/22/2019   ALKPHOS 61 07/01/2011   AST 12 09/22/2019   ALT 9 09/22/2019   PROT 6.9 09/22/2019   ALBUMIN 3.8 07/01/2011   CALCIUM 9.5 09/22/2019   GFRAA 103 09/22/2019   QFTBGOLDPLUS NEGATIVE 07/01/2019    Speciality Comments: No specialty comments available.  Procedures:  No procedures performed Allergies: Patient has no known allergies.   Assessment / Plan:     Visit Diagnoses: Rheumatoid arthritis involving multiple sites with positive rheumatoid factor (HCC) -patient complains of ongoing pain and discomfort in multiple joints.  I did not see any synovitis on my examination.  She has been on  methotrexate, Humira combination for the last 1 month.  I would like to give it more time.  Plan: Sedimentation rate  High risk medication use - Methotrexate 0.8 mL subcu weekly, Humira 40 mg subcu every other week, folic acid 2 mg p.o. daily,  - Plan: CBC with Differential/Platelet, COMPLETE METABOLIC PANEL WITH GFR today and then every 3 months to monitor for drug toxicity.  TB gold was July 01, 2019.  Trochanteric bursitis of right hip-she continues to have mild tenderness over right trochanteric area.  Chondromalacia of both patellae-she complains of knee joint discomfort but no synovitis was noted.  Arthropathy of lumbar facet joint-she has ongoing pain and discomfort in her lower lumbar region.  I will refer her to physical therapy and also refer her to a back specialist.  Vitamin D deficiency-she has been taking  vitamin D 50,000units weekly.  I will check vitamin D levels today.  Abnormal SPEP-patient states she has not seen the hematologist years.  We will look further into it.  Chronic pain syndrome-she has been experiencing generalized pain and discomfort.  She was on Cymbalta at one point.  I am not certain why she stopped it.  Have advised her to discuss this further with her PCP.  Family history of rheumatoid arthritis  Essential hypertension-blood pressure is elevated.  Have advised her to monitor blood pressure closely.  Elevated cholesterol-dietary modifications were discussed.  History of type 2 diabetes mellitus  History of gastroesophageal reflux (GERD)  Anxiety and depression  Educated about COVID-19 virus infection-patient has not been immunized against COVID-19.  We had detailed discussion regarding getting the vaccine immunization.  Increased risk of infection was discussed.  Complications of COVID-19 in a patient with immunosuppression was discussed.  Instructions were placed in the AVS per ACR guidelines.  Orders: Orders Placed This Encounter  Procedures  .  CBC with Differential/Platelet  . COMPLETE METABOLIC PANEL WITH GFR  . Sedimentation rate  . VITAMIN D 25 Hydroxy (Vit-D Deficiency, Fractures)  . Ambulatory referral to Physical Therapy  . Ambulatory referral to Orthopedic Surgery   No orders of the defined types were placed in this encounter.     Follow-Up Instructions: Return in about 3 months (around 04/04/2020) for Rheumatoid arthritis, LBP.   Pollyann Savoy, MD  Note - This record has been created using Animal nutritionist.  Chart creation errors have been sought, but may not always  have been located. Such creation errors do not reflect on  the standard of medical care.

## 2020-01-04 ENCOUNTER — Other Ambulatory Visit: Payer: Self-pay | Admitting: *Deleted

## 2020-01-04 DIAGNOSIS — E559 Vitamin D deficiency, unspecified: Secondary | ICD-10-CM

## 2020-01-04 LAB — COMPLETE METABOLIC PANEL WITH GFR
AG Ratio: 1.3 (calc) (ref 1.0–2.5)
ALT: 15 U/L (ref 6–29)
AST: 13 U/L (ref 10–35)
Albumin: 4.1 g/dL (ref 3.6–5.1)
Alkaline phosphatase (APISO): 64 U/L (ref 37–153)
BUN: 9 mg/dL (ref 7–25)
CO2: 25 mmol/L (ref 20–32)
Calcium: 9.5 mg/dL (ref 8.6–10.4)
Chloride: 106 mmol/L (ref 98–110)
Creat: 0.76 mg/dL (ref 0.50–1.05)
GFR, Est African American: 105 mL/min/{1.73_m2} (ref >=60)
GFR, Est Non African American: 91 mL/min/{1.73_m2} (ref >=60)
Globulin: 3.1 g/dL (calc) (ref 1.9–3.7)
Glucose, Bld: 81 mg/dL (ref 65–99)
Potassium: 3.9 mmol/L (ref 3.5–5.3)
Sodium: 140 mmol/L (ref 135–146)
Total Bilirubin: 0.3 mg/dL (ref 0.2–1.2)
Total Protein: 7.2 g/dL (ref 6.1–8.1)

## 2020-01-04 LAB — CBC WITH DIFFERENTIAL/PLATELET
Absolute Monocytes: 417 cells/uL (ref 200–950)
Basophils Absolute: 39 cells/uL (ref 0–200)
Basophils Relative: 0.9 %
Eosinophils Absolute: 30 cells/uL (ref 15–500)
Eosinophils Relative: 0.7 %
HCT: 34.2 % — ABNORMAL LOW (ref 35.0–45.0)
Hemoglobin: 10.9 g/dL — ABNORMAL LOW (ref 11.7–15.5)
Lymphs Abs: 400 cells/uL — ABNORMAL LOW (ref 850–3900)
MCH: 22.6 pg — ABNORMAL LOW (ref 27.0–33.0)
MCHC: 31.9 g/dL — ABNORMAL LOW (ref 32.0–36.0)
MCV: 71 fL — ABNORMAL LOW (ref 80.0–100.0)
MPV: 10.2 fL (ref 7.5–12.5)
Monocytes Relative: 9.7 %
Neutro Abs: 3414 cells/uL (ref 1500–7800)
Neutrophils Relative %: 79.4 %
Platelets: 395 10*3/uL (ref 140–400)
RBC: 4.82 10*6/uL (ref 3.80–5.10)
RDW: 20.4 % — ABNORMAL HIGH (ref 11.0–15.0)
Total Lymphocyte: 9.3 %
WBC: 4.3 10*3/uL (ref 3.8–10.8)

## 2020-01-04 LAB — VITAMIN D 25 HYDROXY (VIT D DEFICIENCY, FRACTURES): Vit D, 25-Hydroxy: 21 ng/mL — ABNORMAL LOW (ref 30–100)

## 2020-01-04 LAB — SEDIMENTATION RATE: Sed Rate: 36 mm/h — ABNORMAL HIGH (ref 0–30)

## 2020-01-04 MED ORDER — VITAMIN D (ERGOCALCIFEROL) 1.25 MG (50000 UNIT) PO CAPS: 50000.0000 [IU] | ORAL_CAPSULE | ORAL | 0 refills | Status: AC

## 2020-01-04 NOTE — Telephone Encounter (Signed)
-----   Message from Pollyann Savoy, MD sent at 01/04/2020  8:06 AM EDT ----- Anemia persist.  CMP is normal.  Sed rate is mildly elevated.  Vitamin D is low.  Please advise vitamin D 50,000 units once a week for 3 months.  Repeat vitamin D level in 3 months.

## 2020-01-04 NOTE — Progress Notes (Signed)
Anemia persist.  CMP is normal.  Sed rate is mildly elevated.  Vitamin D is low.  Please advise vitamin D 50,000 units once a week for 3 months.  Repeat vitamin D level in 3 months.

## 2020-01-05 ENCOUNTER — Ambulatory Visit: Payer: 59 | Admitting: Physician Assistant

## 2020-01-05 DIAGNOSIS — Z79899 Other long term (current) drug therapy: Secondary | ICD-10-CM

## 2020-01-05 DIAGNOSIS — E7800 Pure hypercholesterolemia, unspecified: Secondary | ICD-10-CM

## 2020-01-05 DIAGNOSIS — M0579 Rheumatoid arthritis with rheumatoid factor of multiple sites without organ or systems involvement: Secondary | ICD-10-CM

## 2020-01-05 DIAGNOSIS — M7061 Trochanteric bursitis, right hip: Secondary | ICD-10-CM

## 2020-01-05 DIAGNOSIS — I10 Essential (primary) hypertension: Secondary | ICD-10-CM

## 2020-01-05 DIAGNOSIS — Z8639 Personal history of other endocrine, nutritional and metabolic disease: Secondary | ICD-10-CM

## 2020-01-05 DIAGNOSIS — M2242 Chondromalacia patellae, left knee: Secondary | ICD-10-CM

## 2020-01-05 DIAGNOSIS — R778 Other specified abnormalities of plasma proteins: Secondary | ICD-10-CM

## 2020-01-05 DIAGNOSIS — E559 Vitamin D deficiency, unspecified: Secondary | ICD-10-CM

## 2020-01-05 DIAGNOSIS — M2241 Chondromalacia patellae, right knee: Secondary | ICD-10-CM

## 2020-01-05 DIAGNOSIS — F419 Anxiety disorder, unspecified: Secondary | ICD-10-CM

## 2020-01-05 DIAGNOSIS — M47816 Spondylosis without myelopathy or radiculopathy, lumbar region: Secondary | ICD-10-CM

## 2020-01-05 DIAGNOSIS — Z8261 Family history of arthritis: Secondary | ICD-10-CM

## 2020-01-05 DIAGNOSIS — G894 Chronic pain syndrome: Secondary | ICD-10-CM

## 2020-01-05 DIAGNOSIS — Z8719 Personal history of other diseases of the digestive system: Secondary | ICD-10-CM

## 2020-01-11 ENCOUNTER — Telehealth (HOSPITAL_COMMUNITY): Payer: Self-pay

## 2020-01-11 ENCOUNTER — Telehealth: Payer: Self-pay | Admitting: Rheumatology

## 2020-01-11 ENCOUNTER — Ambulatory Visit: Payer: 59 | Attending: Rheumatology | Admitting: Physical Therapy

## 2020-01-11 NOTE — Telephone Encounter (Signed)
Patient tested positive for COVID yesterday, and needs to get antibody infusion. Please call patient to advise.

## 2020-01-11 NOTE — Telephone Encounter (Signed)
Spoke with patient and advised she will need to hold her MTX and Humira until 2 weeks after she is symptom free. Contacted the Covid infusion center and left patient information so they may set her up with a monoclonal antibody infusion.

## 2020-01-11 NOTE — Telephone Encounter (Signed)
Attempted to contact the patient and unable to leave a message, mailbox is full.  

## 2020-01-12 ENCOUNTER — Telehealth: Payer: Self-pay | Admitting: Unknown Physician Specialty

## 2020-01-12 ENCOUNTER — Other Ambulatory Visit: Payer: Self-pay | Admitting: Unknown Physician Specialty

## 2020-01-12 DIAGNOSIS — Z8639 Personal history of other endocrine, nutritional and metabolic disease: Secondary | ICD-10-CM

## 2020-01-12 DIAGNOSIS — U071 COVID-19: Secondary | ICD-10-CM

## 2020-01-12 DIAGNOSIS — M0579 Rheumatoid arthritis with rheumatoid factor of multiple sites without organ or systems involvement: Secondary | ICD-10-CM

## 2020-01-12 DIAGNOSIS — E663 Overweight: Secondary | ICD-10-CM

## 2020-01-12 DIAGNOSIS — I10 Essential (primary) hypertension: Secondary | ICD-10-CM

## 2020-01-12 NOTE — Telephone Encounter (Signed)
I connected by phone with Jeanette Potts on 01/12/2020 at 7:43 AM to discuss the potential use of a new treatment for mild to moderate COVID-19 viral infection in non-hospitalized patients.  This patient is a 49 y.o. female that meets the FDA criteria for Emergency Use Authorization of COVID monoclonal antibody casirivimab/imdevimab or bamlanivimab/eteseviamb.  Has a (+) direct SARS-CoV-2 viral test result  Has mild or moderate COVID-19   Is NOT hospitalized due to COVID-19  Is within 10 days of symptom onset  Has at least one of the high risk factor(s) for progression to severe COVID-19 and/or hospitalization as defined in EUA.  Specific high risk criteria : BMI > 25, Immunosuppressive Disease or Treatment and Cardiovascular disease or hypertension   I have spoken and communicated the following to the patient or parent/caregiver regarding COVID monoclonal antibody treatment:  1. FDA has authorized the emergency use for the treatment of mild to moderate COVID-19 in adults and pediatric patients with positive results of direct SARS-CoV-2 viral testing who are 54 years of age and older weighing at least 40 kg, and who are at high risk for progressing to severe COVID-19 and/or hospitalization.  2. The significant known and potential risks and benefits of COVID monoclonal antibody, and the extent to which such potential risks and benefits are unknown.  3. Information on available alternative treatments and the risks and benefits of those alternatives, including clinical trials.  4. Patients treated with COVID monoclonal antibody should continue to self-isolate and use infection control measures (e.g., wear mask, isolate, social distance, avoid sharing personal items, clean and disinfect "high touch" surfaces, and frequent handwashing) according to CDC guidelines.   5. The patient or parent/caregiver has the option to accept or refuse COVID monoclonal antibody treatment.  After reviewing  this information with the patient, the patient has agreed to receive one of the available covid 19 monoclonal antibodies and will be provided an appropriate fact sheet prior to infusion. Gabriel Cirri, NP 01/12/2020 7:43 AM  Sx onset 10/22

## 2020-01-13 ENCOUNTER — Ambulatory Visit (HOSPITAL_COMMUNITY)
Admission: RE | Admit: 2020-01-13 | Discharge: 2020-01-13 | Disposition: A | Discharge status: Home / Self Care | Payer: 59 | Source: Ambulatory Visit | Attending: Pulmonary Disease | Admitting: Pulmonary Disease

## 2020-01-13 DIAGNOSIS — U071 COVID-19: Secondary | ICD-10-CM | POA: Insufficient documentation

## 2020-01-13 DIAGNOSIS — E663 Overweight: Secondary | ICD-10-CM | POA: Insufficient documentation

## 2020-01-13 DIAGNOSIS — I10 Essential (primary) hypertension: Secondary | ICD-10-CM | POA: Diagnosis not present

## 2020-01-13 DIAGNOSIS — Z8639 Personal history of other endocrine, nutritional and metabolic disease: Secondary | ICD-10-CM | POA: Diagnosis present

## 2020-01-13 DIAGNOSIS — M0579 Rheumatoid arthritis with rheumatoid factor of multiple sites without organ or systems involvement: Secondary | ICD-10-CM | POA: Diagnosis present

## 2020-01-13 MED ORDER — METHYLPREDNISOLONE SODIUM SUCC 125 MG IJ SOLR: 125.0000 mg | Freq: Once | INTRAMUSCULAR | Status: DC | PRN

## 2020-01-13 MED ORDER — FAMOTIDINE IN NACL 20-0.9 MG/50ML-% IV SOLN: 20.0000 mg | Freq: Once | INTRAVENOUS | Status: DC | PRN

## 2020-01-13 MED ORDER — ALBUTEROL SULFATE HFA 108 (90 BASE) MCG/ACT IN AERS: 2.0000 | INHALATION_SPRAY | Freq: Once | RESPIRATORY_TRACT | Status: DC | PRN

## 2020-01-13 MED ORDER — SODIUM CHLORIDE 0.9 % IV SOLN: Freq: Once | INTRAVENOUS | Status: AC

## 2020-01-13 MED ORDER — DIPHENHYDRAMINE HCL 50 MG/ML IJ SOLN: 50.0000 mg | Freq: Once | INTRAMUSCULAR | Status: DC | PRN

## 2020-01-13 MED ORDER — EPINEPHRINE 0.3 MG/0.3ML IJ SOAJ: 0.3000 mg | Freq: Once | INTRAMUSCULAR | Status: DC | PRN

## 2020-01-13 MED ORDER — ACETAMINOPHEN 325 MG PO TABS
650.0000 mg | ORAL_TABLET | Freq: Four times a day (QID) | ORAL | Status: DC | PRN
  Administered 2020-01-13: 650 mg via ORAL
  Filled 2020-01-13: qty 2

## 2020-01-13 MED ORDER — SODIUM CHLORIDE 0.9 % IV SOLN: INTRAVENOUS | Status: DC | PRN

## 2020-01-13 MED ORDER — SODIUM CHLORIDE 0.9 % IV SOLN: INTRAVENOUS | Status: DC

## 2020-01-13 NOTE — Discharge Instructions (Signed)

## 2020-01-13 NOTE — Progress Notes (Signed)
  Diagnosis: COVID-19  Physician: Dr. Wright  Procedure: [facility] Infusion [facility] Med: bamlanivimab\etesevimab infusion - Provided patient with bamlanimivab\etesevimab fact sheet for patients, parents and caregivers prior to infusion.  Complications: No immediate complications noted.  Discharge: Discharged home   Taylore Hinde  B Mila Pair 01/13/2020  

## 2020-01-20 NOTE — Telephone Encounter (Signed)
Submitted a Prior Authorization request to [facility] for HUMIRA via Fax. Will update once we receive a response.

## 2020-01-27 ENCOUNTER — Telehealth: Payer: Self-pay

## 2020-01-27 NOTE — Telephone Encounter (Signed)
Received notification from [facility] regarding a prior authorization for HUMIRA. Authorization has been APPROVED from 01/23/20 to 01/22/23.

## 2020-01-27 NOTE — Telephone Encounter (Signed)
Patient called stating she had her antibody infusion 2 weeks ago and is requesting a return call to let her know when she can restart her Humira and Methotrexate medications.

## 2020-01-27 NOTE — Telephone Encounter (Signed)
She can restart medications 1 month post Covid infection.

## 2020-01-27 NOTE — Telephone Encounter (Signed)
Patient advised she can restart her medications 1 month post Covid infection.

## 2020-02-15 NOTE — Telephone Encounter (Signed)
Done

## 2020-02-16 ENCOUNTER — Other Ambulatory Visit: Payer: Self-pay | Admitting: *Deleted

## 2020-02-16 DIAGNOSIS — M0579 Rheumatoid arthritis with rheumatoid factor of multiple sites without organ or systems involvement: Secondary | ICD-10-CM

## 2020-02-16 MED ORDER — HUMIRA (2 PEN) 40 MG/0.4ML ~~LOC~~ AJKT: 40.0000 mg | AUTO-INJECTOR | SUBCUTANEOUS | 0 refills | Status: DC

## 2020-02-16 NOTE — Telephone Encounter (Signed)
Refill request received via fax  Last Visit: 01/03/2020 Next Visit: 04/04/2020 Labs: 01/03/2020 Anemia persist. CMP is normal.  TB Gold: 07/01/2019 Neg   Current Dose per office note 01/03/2020: Humira 40 mg subcu every other week  DX: Rheumatoid arthritis involving multiple sites with positive rheumatoid factor   Okay to refill Humira?

## 2020-02-17 ENCOUNTER — Ambulatory Visit: Payer: 59 | Admitting: Specialist

## 2020-03-06 ENCOUNTER — Other Ambulatory Visit: Payer: Self-pay | Admitting: Rheumatology

## 2020-03-07 NOTE — Telephone Encounter (Signed)
Last Visit: 01/03/2020 Next Visit: 04/04/2020 Labs: 01/03/2020 Anemia persist. CMP is normal.  Current Dose per office note 01/03/2020: Methotrexate 0.8 mL subcu weekly DX: Rheumatoid arthritis involving multiple sites with positive rheumatoid factor   Okay to refill MTX?

## 2020-03-08 ENCOUNTER — Telehealth: Payer: Self-pay | Admitting: Rheumatology

## 2020-03-08 DIAGNOSIS — G8929 Other chronic pain: Secondary | ICD-10-CM

## 2020-03-08 NOTE — Telephone Encounter (Signed)
Spoke with pharmacy and they confirmed they have prescription for MTX (patient is on vial and syringe). Patient advised her prescription has been sent to the pharmacy and they have it ready for pick up.   Patient states is having pain and inflammation in her lower back. Patient states she take her MTX on Thursday's and by Monday she is having issues with her back. Patient had to leave work early today. Patient is requesting a prescription for Prednisone.

## 2020-03-08 NOTE — Telephone Encounter (Signed)
Patient needs a refill on Resuvo, and syringes sent to [facility].

## 2020-03-09 MED ORDER — METHOCARBAMOL 500 MG PO TABS: 500.0000 mg | ORAL_TABLET | Freq: Two times a day (BID) | ORAL | 0 refills | Status: DC | PRN

## 2020-03-09 NOTE — Telephone Encounter (Signed)
Spoke with patient and advised per Dr. Corliss Skains the prednisone is not the best option for the pain she is experiencing. Patient advised Dr. Corliss Skains did offer a muscle relaxer and she is in agreement. Patient states she did not do well with Cyclobenzaprine in the past. Patient is also in agreement for a back specialist.

## 2020-03-09 NOTE — Telephone Encounter (Signed)
Please call in methocarbamol 500 mg p.o. twice daily as needed muscle spasm total 30 tablets with no refills.  Please refer her to Dr. Otelia Sergeant for evaluation.

## 2020-03-16 ENCOUNTER — Telehealth: Payer: Self-pay | Admitting: *Deleted

## 2020-03-16 DIAGNOSIS — M0579 Rheumatoid arthritis with rheumatoid factor of multiple sites without organ or systems involvement: Secondary | ICD-10-CM

## 2020-03-16 NOTE — Telephone Encounter (Signed)
Patient contacted the office and states she was advised by [facility] pharmacy that her insurance is no longer using that mail order pharmacy to fill Humira. Patient states she is unsure of which pharmacy they are using now. Can you please advise.

## 2020-03-20 MED ORDER — HUMIRA (2 PEN) 40 MG/0.4ML ~~LOC~~ AJKT: 40.0000 mg | AUTO-INJECTOR | SUBCUTANEOUS | 0 refills | Status: DC

## 2020-03-20 NOTE — Telephone Encounter (Signed)
Ran eligibility check and did not find any active coverage. Called patient and she advised that she re-enrolled with [employer] for 2022 on 03/14/20.  Called [employer], they did switch to [employer] for 2022 calendar year.  Patient must now fill through [facility]. Phone# [phone]. Please send in rx for patient with copay card info.  Humira Copay card:  Card: [id number]  Rx GROUP: [id number]  Rx BIN: [id number]  Rx PCN: [id number]  Suf: 01  Called patient and advised.

## 2020-03-20 NOTE — Addendum Note (Signed)
Addended by: Murrell Redden on: 03/20/2020 11:36 AM   Modules accepted: Orders

## 2020-03-20 NOTE — Telephone Encounter (Addendum)
Rx for Humira Pen 40mg  every 14 days sent to [facility] with copay card information.  CBC/CMP due 04/04/20 at appt with 04/06/20, PA-C. TB gold due 07/01/19.  Nothing further needed.  07/03/19, PharmD, MPH Clinical Pharmacist (Rheumatology and Pulmonology)

## 2020-03-21 ENCOUNTER — Telehealth: Payer: Self-pay | Admitting: Rheumatology

## 2020-03-21 NOTE — Telephone Encounter (Signed)
Patient has changed to a new insurance, and needs RX for Humira sent to [facility]. Patient has been out of medication for two weeks now, per patient.

## 2020-03-21 NOTE — Progress Notes (Deleted)
Office Visit Note  Patient: Jeanette Potts             Date of Birth: 02-09-1971           MRN: [id number]             PCP: Laruth Bouchard, MD Referring: Laruth Bouchard, MD Visit Date: 04/04/2020 Occupation: @GUAROCC @  Subjective:  No chief complaint on file.   History of Present Illness: Jeanette Potts is a 50 y.o. female ***   Activities of Daily Living:  Patient reports morning stiffness for *** {minute/hour:19697}.   Patient {ACTIONS;DENIES/REPORTS:21021675::"Denies"} nocturnal pain.  Difficulty dressing/grooming: {ACTIONS;DENIES/REPORTS:21021675::"Denies"} Difficulty climbing stairs: {ACTIONS;DENIES/REPORTS:21021675::"Denies"} Difficulty getting out of chair: {ACTIONS;DENIES/REPORTS:21021675::"Denies"} Difficulty using hands for taps, buttons, cutlery, and/or writing: {ACTIONS;DENIES/REPORTS:21021675::"Denies"}  No Rheumatology ROS completed.   PMFS History:  Patient Active Problem List   Diagnosis Date Noted  . Rheumatoid arthritis involving multiple sites with positive rheumatoid factor (HCC) 01/03/2020  . Trochanteric bursitis of right hip 01/03/2020  . Arthropathy of lumbar facet joint 01/03/2020  . Chronic pain syndrome 07/01/2019  . Anxiety and depression 07/01/2019  . History of gastroesophageal reflux (GERD) 07/01/2019  . History of type 2 diabetes mellitus 07/01/2019  . Essential hypertension 07/01/2019  . Elevated cholesterol 07/01/2019    Past Medical History:  Diagnosis Date  . Diabetes mellitus   . Hypertension   . Rheumatoid arthritis (HCC)     Family History  Problem Relation Age of Onset  . Diabetes Mother   . Hypertension Mother   . Rheum arthritis Mother   . Heart attack Father   . Hypertension Brother    No past surgical history on file. Social History   Social History Narrative  . Not on file    There is no immunization history on file for this patient.   Objective: Vital Signs: There were no vitals taken for this visit.    Physical Exam   Musculoskeletal Exam: ***  CDAI Exam: CDAI Score: -- Patient Global: --; Provider Global: -- Swollen: --; Tender: -- Joint Exam 04/04/2020   No joint exam has been documented for this visit   There is currently no information documented on the homunculus. Go to the Rheumatology activity and complete the homunculus joint exam.  Investigation: No additional findings.  Imaging: No results found.  Recent Labs: Lab Results  Component Value Date   WBC 4.3 01/03/2020   HGB 10.9 (L) 01/03/2020   PLT 395 01/03/2020   NA 140 01/03/2020   K 3.9 01/03/2020   CL 106 01/03/2020   CO2 25 01/03/2020   GLUCOSE 81 01/03/2020   BUN 9 01/03/2020   CREATININE 0.76 01/03/2020   BILITOT 0.3 01/03/2020   ALKPHOS 61 07/01/2011   AST 13 01/03/2020   ALT 15 01/03/2020   PROT 7.2 01/03/2020   ALBUMIN 3.8 07/01/2011   CALCIUM 9.5 01/03/2020   GFRAA 105 01/03/2020   QFTBGOLDPLUS NEGATIVE 07/01/2019    Speciality Comments: No specialty comments available.  Procedures:  No procedures performed Allergies: Patient has no known allergies.   Assessment / Plan:     Visit Diagnoses: No diagnosis found.  Orders: No orders of the defined types were placed in this encounter.  No orders of the defined types were placed in this encounter.   Face-to-face time spent with patient was *** minutes. Greater than 50% of time was spent in counseling and coordination of care.  Follow-Up Instructions: No follow-ups on file.   07/03/2019, CMA  Note - This record  has been created using [employer].  Chart creation errors have been sought, but may not always  have been located. Such creation errors do not reflect on  the standard of medical care.

## 2020-03-21 NOTE — Telephone Encounter (Signed)
Patient advised her prescription was sent in on 03/20/2020 to [facility] as per insurance requirement.  Patient provided with phone number to contact and will call the office if she has any questions or concerns.

## 2020-03-31 NOTE — Progress Notes (Deleted)
Office Visit Note  Patient: Jeanette Potts             Date of Birth: 1970-05-14           MRN: [id number]             PCP: Laruth Bouchard, MD Referring: Laruth Bouchard, MD Visit Date: 04/07/2020 Occupation: @GUAROCC @  Subjective:  No chief complaint on file.   History of Present Illness: Jeanette Potts is a 50 y.o. female ***   Activities of Daily Living:  Patient reports morning stiffness for *** {minute/hour:19697}.   Patient {ACTIONS;DENIES/REPORTS:21021675::"Denies"} nocturnal pain.  Difficulty dressing/grooming: {ACTIONS;DENIES/REPORTS:21021675::"Denies"} Difficulty climbing stairs: {ACTIONS;DENIES/REPORTS:21021675::"Denies"} Difficulty getting out of chair: {ACTIONS;DENIES/REPORTS:21021675::"Denies"} Difficulty using hands for taps, buttons, cutlery, and/or writing: {ACTIONS;DENIES/REPORTS:21021675::"Denies"}  No Rheumatology ROS completed.   PMFS History:  Patient Active Problem List   Diagnosis Date Noted  . Rheumatoid arthritis involving multiple sites with positive rheumatoid factor (HCC) 01/03/2020  . Trochanteric bursitis of right hip 01/03/2020  . Arthropathy of lumbar facet joint 01/03/2020  . Chronic pain syndrome 07/01/2019  . Anxiety and depression 07/01/2019  . History of gastroesophageal reflux (GERD) 07/01/2019  . History of type 2 diabetes mellitus 07/01/2019  . Essential hypertension 07/01/2019  . Elevated cholesterol 07/01/2019    Past Medical History:  Diagnosis Date  . Diabetes mellitus   . Hypertension   . Rheumatoid arthritis (HCC)     Family History  Problem Relation Age of Onset  . Diabetes Mother   . Hypertension Mother   . Rheum arthritis Mother   . Heart attack Father   . Hypertension Brother    No past surgical history on file. Social History   Social History Narrative  . Not on file    There is no immunization history on file for this patient.   Objective: Vital Signs: There were no vitals taken for this visit.    Physical Exam   Musculoskeletal Exam: ***  CDAI Exam: CDAI Score: -- Patient Global: --; Provider Global: -- Swollen: --; Tender: -- Joint Exam 04/07/2020   No joint exam has been documented for this visit   There is currently no information documented on the homunculus. Go to the Rheumatology activity and complete the homunculus joint exam.  Investigation: No additional findings.  Imaging: No results found.  Recent Labs: Lab Results  Component Value Date   WBC 4.3 01/03/2020   HGB 10.9 (L) 01/03/2020   PLT 395 01/03/2020   NA 140 01/03/2020   K 3.9 01/03/2020   CL 106 01/03/2020   CO2 25 01/03/2020   GLUCOSE 81 01/03/2020   BUN 9 01/03/2020   CREATININE 0.76 01/03/2020   BILITOT 0.3 01/03/2020   ALKPHOS 61 07/01/2011   AST 13 01/03/2020   ALT 15 01/03/2020   PROT 7.2 01/03/2020   ALBUMIN 3.8 07/01/2011   CALCIUM 9.5 01/03/2020   GFRAA 105 01/03/2020   QFTBGOLDPLUS NEGATIVE 07/01/2019    Speciality Comments: No specialty comments available.  Procedures:  No procedures performed Allergies: Patient has no known allergies.   Assessment / Plan:     Visit Diagnoses: No diagnosis found.  Orders: No orders of the defined types were placed in this encounter.  No orders of the defined types were placed in this encounter.   Face-to-face time spent with patient was *** minutes. Greater than 50% of time was spent in counseling and coordination of care.  Follow-Up Instructions: No follow-ups on file.   07/03/2019, CMA  Note - This record  has been created using [employer].  Chart creation errors have been sought, but may not always  have been located. Such creation errors do not reflect on  the standard of medical care.

## 2020-04-04 ENCOUNTER — Ambulatory Visit: Payer: 59 | Admitting: Physician Assistant

## 2020-04-04 DIAGNOSIS — Z8719 Personal history of other diseases of the digestive system: Secondary | ICD-10-CM

## 2020-04-04 DIAGNOSIS — M7061 Trochanteric bursitis, right hip: Secondary | ICD-10-CM

## 2020-04-04 DIAGNOSIS — Z8639 Personal history of other endocrine, nutritional and metabolic disease: Secondary | ICD-10-CM

## 2020-04-04 DIAGNOSIS — E7800 Pure hypercholesterolemia, unspecified: Secondary | ICD-10-CM

## 2020-04-04 DIAGNOSIS — M2241 Chondromalacia patellae, right knee: Secondary | ICD-10-CM

## 2020-04-04 DIAGNOSIS — F32A Depression, unspecified: Secondary | ICD-10-CM

## 2020-04-04 DIAGNOSIS — E559 Vitamin D deficiency, unspecified: Secondary | ICD-10-CM

## 2020-04-04 DIAGNOSIS — R778 Other specified abnormalities of plasma proteins: Secondary | ICD-10-CM

## 2020-04-04 DIAGNOSIS — Z79899 Other long term (current) drug therapy: Secondary | ICD-10-CM

## 2020-04-04 DIAGNOSIS — I10 Essential (primary) hypertension: Secondary | ICD-10-CM

## 2020-04-04 DIAGNOSIS — M0579 Rheumatoid arthritis with rheumatoid factor of multiple sites without organ or systems involvement: Secondary | ICD-10-CM

## 2020-04-04 DIAGNOSIS — Z8261 Family history of arthritis: Secondary | ICD-10-CM

## 2020-04-04 DIAGNOSIS — M47816 Spondylosis without myelopathy or radiculopathy, lumbar region: Secondary | ICD-10-CM

## 2020-04-04 DIAGNOSIS — G894 Chronic pain syndrome: Secondary | ICD-10-CM

## 2020-04-05 NOTE — Progress Notes (Deleted)
Office Visit Note  Patient: Jeanette Potts             Date of Birth: 1970/04/22           MRN: [id number]             PCP: Laruth Bouchard, MD Referring: Laruth Bouchard, MD Visit Date: 04/10/2020 Occupation: @GUAROCC @  Subjective:  No chief complaint on file.   History of Present Illness: Jeanette Potts is a 50 y.o. female ***   Activities of Daily Living:  Patient reports morning stiffness for *** {minute/hour:19697}.   Patient {ACTIONS;DENIES/REPORTS:21021675::"Denies"} nocturnal pain.  Difficulty dressing/grooming: {ACTIONS;DENIES/REPORTS:21021675::"Denies"} Difficulty climbing stairs: {ACTIONS;DENIES/REPORTS:21021675::"Denies"} Difficulty getting out of chair: {ACTIONS;DENIES/REPORTS:21021675::"Denies"} Difficulty using hands for taps, buttons, cutlery, and/or writing: {ACTIONS;DENIES/REPORTS:21021675::"Denies"}  No Rheumatology ROS completed.   PMFS History:  Patient Active Problem List   Diagnosis Date Noted  . Rheumatoid arthritis involving multiple sites with positive rheumatoid factor (HCC) 01/03/2020  . Trochanteric bursitis of right hip 01/03/2020  . Arthropathy of lumbar facet joint 01/03/2020  . Chronic pain syndrome 07/01/2019  . Anxiety and depression 07/01/2019  . History of gastroesophageal reflux (GERD) 07/01/2019  . History of type 2 diabetes mellitus 07/01/2019  . Essential hypertension 07/01/2019  . Elevated cholesterol 07/01/2019    Past Medical History:  Diagnosis Date  . Diabetes mellitus   . Hypertension   . Rheumatoid arthritis (HCC)     Family History  Problem Relation Age of Onset  . Diabetes Mother   . Hypertension Mother   . Rheum arthritis Mother   . Heart attack Father   . Hypertension Brother    No past surgical history on file. Social History   Social History Narrative  . Not on file    There is no immunization history on file for this patient.   Objective: Vital Signs: There were no vitals taken for this visit.    Physical Exam   Musculoskeletal Exam: ***  CDAI Exam: CDAI Score: -- Patient Global: --; Provider Global: -- Swollen: --; Tender: -- Joint Exam 04/10/2020   No joint exam has been documented for this visit   There is currently no information documented on the homunculus. Go to the Rheumatology activity and complete the homunculus joint exam.  Investigation: No additional findings.  Imaging: No results found.  Recent Labs: Lab Results  Component Value Date   WBC 4.3 01/03/2020   HGB 10.9 (L) 01/03/2020   PLT 395 01/03/2020   NA 140 01/03/2020   K 3.9 01/03/2020   CL 106 01/03/2020   CO2 25 01/03/2020   GLUCOSE 81 01/03/2020   BUN 9 01/03/2020   CREATININE 0.76 01/03/2020   BILITOT 0.3 01/03/2020   ALKPHOS 61 07/01/2011   AST 13 01/03/2020   ALT 15 01/03/2020   PROT 7.2 01/03/2020   ALBUMIN 3.8 07/01/2011   CALCIUM 9.5 01/03/2020   GFRAA 105 01/03/2020   QFTBGOLDPLUS NEGATIVE 07/01/2019    Speciality Comments: No specialty comments available.  Procedures:  No procedures performed Allergies: Patient has no known allergies.   Assessment / Plan:     Visit Diagnoses: No diagnosis found.  Orders: No orders of the defined types were placed in this encounter.  No orders of the defined types were placed in this encounter.   Face-to-face time spent with patient was *** minutes. Greater than 50% of time was spent in counseling and coordination of care.  Follow-Up Instructions: No follow-ups on file.   07/03/2019, CMA  Note - This record  has been created using [employer].  Chart creation errors have been sought, but may not always  have been located. Such creation errors do not reflect on  the standard of medical care.

## 2020-04-07 ENCOUNTER — Ambulatory Visit: Payer: 59 | Admitting: Physician Assistant

## 2020-04-07 DIAGNOSIS — G894 Chronic pain syndrome: Secondary | ICD-10-CM

## 2020-04-07 DIAGNOSIS — M47816 Spondylosis without myelopathy or radiculopathy, lumbar region: Secondary | ICD-10-CM

## 2020-04-07 DIAGNOSIS — Z8261 Family history of arthritis: Secondary | ICD-10-CM

## 2020-04-07 DIAGNOSIS — I10 Essential (primary) hypertension: Secondary | ICD-10-CM

## 2020-04-07 DIAGNOSIS — M7061 Trochanteric bursitis, right hip: Secondary | ICD-10-CM

## 2020-04-07 DIAGNOSIS — E559 Vitamin D deficiency, unspecified: Secondary | ICD-10-CM

## 2020-04-07 DIAGNOSIS — M0579 Rheumatoid arthritis with rheumatoid factor of multiple sites without organ or systems involvement: Secondary | ICD-10-CM

## 2020-04-07 DIAGNOSIS — F32A Depression, unspecified: Secondary | ICD-10-CM

## 2020-04-07 DIAGNOSIS — M2241 Chondromalacia patellae, right knee: Secondary | ICD-10-CM

## 2020-04-07 DIAGNOSIS — Z8639 Personal history of other endocrine, nutritional and metabolic disease: Secondary | ICD-10-CM

## 2020-04-07 DIAGNOSIS — Z79899 Other long term (current) drug therapy: Secondary | ICD-10-CM

## 2020-04-07 DIAGNOSIS — Z8719 Personal history of other diseases of the digestive system: Secondary | ICD-10-CM

## 2020-04-07 DIAGNOSIS — R778 Other specified abnormalities of plasma proteins: Secondary | ICD-10-CM

## 2020-04-07 DIAGNOSIS — E7800 Pure hypercholesterolemia, unspecified: Secondary | ICD-10-CM

## 2020-04-10 ENCOUNTER — Ambulatory Visit: Payer: 59 | Admitting: Physician Assistant

## 2020-04-10 DIAGNOSIS — Z8261 Family history of arthritis: Secondary | ICD-10-CM

## 2020-04-10 DIAGNOSIS — Z79899 Other long term (current) drug therapy: Secondary | ICD-10-CM

## 2020-04-10 DIAGNOSIS — E7800 Pure hypercholesterolemia, unspecified: Secondary | ICD-10-CM

## 2020-04-10 DIAGNOSIS — M47816 Spondylosis without myelopathy or radiculopathy, lumbar region: Secondary | ICD-10-CM

## 2020-04-10 DIAGNOSIS — G894 Chronic pain syndrome: Secondary | ICD-10-CM

## 2020-04-10 DIAGNOSIS — M2242 Chondromalacia patellae, left knee: Secondary | ICD-10-CM

## 2020-04-10 DIAGNOSIS — R778 Other specified abnormalities of plasma proteins: Secondary | ICD-10-CM

## 2020-04-10 DIAGNOSIS — Z8719 Personal history of other diseases of the digestive system: Secondary | ICD-10-CM

## 2020-04-10 DIAGNOSIS — M7061 Trochanteric bursitis, right hip: Secondary | ICD-10-CM

## 2020-04-10 DIAGNOSIS — M0579 Rheumatoid arthritis with rheumatoid factor of multiple sites without organ or systems involvement: Secondary | ICD-10-CM

## 2020-04-10 DIAGNOSIS — I10 Essential (primary) hypertension: Secondary | ICD-10-CM

## 2020-04-10 DIAGNOSIS — E559 Vitamin D deficiency, unspecified: Secondary | ICD-10-CM

## 2020-04-10 DIAGNOSIS — Z8639 Personal history of other endocrine, nutritional and metabolic disease: Secondary | ICD-10-CM

## 2020-04-10 DIAGNOSIS — F419 Anxiety disorder, unspecified: Secondary | ICD-10-CM

## 2020-04-17 NOTE — Progress Notes (Deleted)
Virtual Visit via Video Note  I connected with Jeanette Potts on 04/17/20 at  4:00 PM EST by a video enabled telemedicine application and verified that I am speaking with the correct person using two identifiers.  Location: Patient: *** Provider: ***   I discussed the limitations of evaluation and management by telemedicine and the availability of in person appointments. The patient expressed understanding and agreed to proceed.  CC: History of Present Illness: Patient is a 50 year old female with a past medical history   Review of Systems  Constitutional: Negative for fever and malaise/fatigue.  Eyes: Negative for photophobia, pain, discharge and redness.  Respiratory: Negative for cough, shortness of breath and wheezing.   Cardiovascular: Negative for chest pain and palpitations.  Gastrointestinal: Negative for blood in stool, constipation and diarrhea.  Genitourinary: Negative for dysuria.  Musculoskeletal: Negative for back pain, joint pain, myalgias and neck pain.  Skin: Negative for rash.  Neurological: Negative for dizziness and headaches.  Psychiatric/Behavioral: Negative for depression. The patient is not nervous/anxious and does not have insomnia.       Observations/Objective: Physical Exam HENT:     Head: Normocephalic and atraumatic.  Eyes:     Conjunctiva/sclera: Conjunctivae normal.  Pulmonary:     Effort: Pulmonary effort is normal.  Neurological:     Mental Status: She is alert and oriented to person, place, and time.  Psychiatric:        Mood and Affect: Mood and affect normal.        Cognition and Memory: Memory normal.        Judgment: Judgment normal.    Patient reports morning stiffness for *** {minute/hour:19697}.   Patient {Actions; denies-reports:120008} nocturnal pain.  Difficulty dressing/grooming: {ACTIONS;DENIES/REPORTS:21021675::"Denies"} Difficulty climbing stairs: {ACTIONS;DENIES/REPORTS:21021675::"Denies"} Difficulty getting out of  chair: {ACTIONS;DENIES/REPORTS:21021675::"Denies"} Difficulty using hands for taps, buttons, cutlery, and/or writing: {ACTIONS;DENIES/REPORTS:21021675::"Denies"}   Assessment and Plan:   Follow Up Instructions:    I discussed the assessment and treatment plan with the patient. The patient was provided an opportunity to ask questions and all were answered. The patient agreed with the plan and demonstrated an understanding of the instructions.   The patient was advised to call back or seek an in-person evaluation if the symptoms worsen or if the condition fails to improve as anticipated.  I provided *** minutes of non-face-to-face time during this encounter.   Gearldine Bienenstock, PA-C

## 2020-04-20 ENCOUNTER — Telehealth: Payer: 59 | Admitting: Rheumatology

## 2020-04-20 ENCOUNTER — Other Ambulatory Visit: Payer: Self-pay

## 2020-04-20 DIAGNOSIS — Z8719 Personal history of other diseases of the digestive system: Secondary | ICD-10-CM

## 2020-04-20 DIAGNOSIS — M0579 Rheumatoid arthritis with rheumatoid factor of multiple sites without organ or systems involvement: Secondary | ICD-10-CM

## 2020-04-20 DIAGNOSIS — E559 Vitamin D deficiency, unspecified: Secondary | ICD-10-CM

## 2020-04-20 DIAGNOSIS — M7061 Trochanteric bursitis, right hip: Secondary | ICD-10-CM

## 2020-04-20 DIAGNOSIS — G894 Chronic pain syndrome: Secondary | ICD-10-CM

## 2020-04-20 DIAGNOSIS — Z8261 Family history of arthritis: Secondary | ICD-10-CM

## 2020-04-20 DIAGNOSIS — M47816 Spondylosis without myelopathy or radiculopathy, lumbar region: Secondary | ICD-10-CM

## 2020-04-20 DIAGNOSIS — Z8639 Personal history of other endocrine, nutritional and metabolic disease: Secondary | ICD-10-CM

## 2020-04-20 DIAGNOSIS — R778 Other specified abnormalities of plasma proteins: Secondary | ICD-10-CM

## 2020-04-20 DIAGNOSIS — F419 Anxiety disorder, unspecified: Secondary | ICD-10-CM

## 2020-04-20 DIAGNOSIS — M2241 Chondromalacia patellae, right knee: Secondary | ICD-10-CM

## 2020-04-20 DIAGNOSIS — I10 Essential (primary) hypertension: Secondary | ICD-10-CM

## 2020-04-20 DIAGNOSIS — E7800 Pure hypercholesterolemia, unspecified: Secondary | ICD-10-CM

## 2020-04-20 DIAGNOSIS — Z79899 Other long term (current) drug therapy: Secondary | ICD-10-CM

## 2020-04-26 ENCOUNTER — Ambulatory Visit: Payer: 59 | Admitting: Specialist

## 2020-06-02 ENCOUNTER — Ambulatory Visit: Payer: 59 | Admitting: Rheumatology

## 2020-06-02 DIAGNOSIS — R778 Other specified abnormalities of plasma proteins: Secondary | ICD-10-CM

## 2020-06-02 DIAGNOSIS — I10 Essential (primary) hypertension: Secondary | ICD-10-CM

## 2020-06-02 DIAGNOSIS — Z8639 Personal history of other endocrine, nutritional and metabolic disease: Secondary | ICD-10-CM

## 2020-06-02 DIAGNOSIS — E559 Vitamin D deficiency, unspecified: Secondary | ICD-10-CM

## 2020-06-02 DIAGNOSIS — Z8719 Personal history of other diseases of the digestive system: Secondary | ICD-10-CM

## 2020-06-02 DIAGNOSIS — M0579 Rheumatoid arthritis with rheumatoid factor of multiple sites without organ or systems involvement: Secondary | ICD-10-CM

## 2020-06-02 DIAGNOSIS — Z79899 Other long term (current) drug therapy: Secondary | ICD-10-CM

## 2020-06-02 DIAGNOSIS — M47816 Spondylosis without myelopathy or radiculopathy, lumbar region: Secondary | ICD-10-CM

## 2020-06-02 DIAGNOSIS — G894 Chronic pain syndrome: Secondary | ICD-10-CM

## 2020-06-02 DIAGNOSIS — Z8261 Family history of arthritis: Secondary | ICD-10-CM

## 2020-06-02 DIAGNOSIS — M7061 Trochanteric bursitis, right hip: Secondary | ICD-10-CM

## 2020-06-02 DIAGNOSIS — M2241 Chondromalacia patellae, right knee: Secondary | ICD-10-CM

## 2020-06-02 DIAGNOSIS — F32A Depression, unspecified: Secondary | ICD-10-CM

## 2020-06-06 ENCOUNTER — Ambulatory Visit: Payer: 59 | Admitting: Physician Assistant

## 2020-06-15 ENCOUNTER — Other Ambulatory Visit: Payer: Self-pay | Admitting: Physician Assistant

## 2020-06-15 DIAGNOSIS — M0579 Rheumatoid arthritis with rheumatoid factor of multiple sites without organ or systems involvement: Secondary | ICD-10-CM

## 2020-06-15 NOTE — Telephone Encounter (Signed)
;  lease call patient to schedule appt, please call after 4:00 pm. Thank you.  Return in about 3 months (around 04/04/2020) for Rheumatoid arthritis, LBP

## 2020-06-15 NOTE — Telephone Encounter (Signed)
Next Visit: message sent to front desk to schedule appt, Return in about 3 months (around 04/04/2020) for Rheumatoid arthritis, LBP  Last Visit: 01/03/2020   Last Fill: 03/20/2020  DX: Rheumatoid arthritis involving multiple sites with positive rheumatoid factor  Current Dose per office note 01/03/2020, Humira 40 mg subcu every other week  Labs: 05/24/2020, RBC 5.53, MCV 68, MCH 20.4, MCHC 30.1, RDW 19.2 - in Care Everywhere  TB Gold: 07/01/2019   Okay to refill Humira?

## 2020-06-16 NOTE — Progress Notes (Signed)
Office Visit Note  Patient: Jeanette Potts             Date of Birth: [birth year]           MRN: [id number]             PCP: Laruth Bouchard, MD Referring: Laruth Bouchard, MD Visit Date: 06/20/2020 Occupation: @  Subjective:  Pain in multiple joints   History of Present Illness: Jeanette Potts is a 50 y.o. female with history of rheumatoid arthritis.  She is currently on methotrexate 0.8 mL sq injections once weekly, folic acid 2 mg by mouth daily, and Humira 40 mg sq injections every 14 days.  According to the patient she is overdue for both medications due to requiring a refill.  She states that she has had difficulty with the Humira injections and typically the medication leaks down her leg each time.  She does not feel comfortable with the autoinjector and would like to discuss other treatment options.  She continues to have persistent pain and swelling in both hands, both wrists, and both knee joints.  She has tenderness in both shoulder joints.  She has ongoing pain in both ankle joints, which makes it difficult to walk at times.  She has tried using Tylenol 650 mg 3 times daily as needed for pain relief as well as Voltaren gel topically but has not noticed any improvement in her symptoms. She denies any recent infections.     Activities of Daily Living:  Patient reports morning stiffness for all day. Patient Reports nocturnal pain.  Difficulty dressing/grooming: Reports Difficulty climbing stairs: Reports Difficulty getting out of chair: Reports Difficulty using hands for taps, buttons, cutlery, and/or writing: Reports  Review of Systems  Constitutional: Positive for fatigue.  HENT: Negative for mouth sores, mouth dryness and nose dryness.   Eyes: Positive for dryness. Negative for pain and itching.  Respiratory: Negative for shortness of breath and difficulty breathing.   Cardiovascular: Negative for chest pain and palpitations.  Gastrointestinal: Positive for  constipation. Negative for blood in stool and diarrhea.  Endocrine: Positive for increased urination.  Genitourinary: Negative for difficulty urinating.  Musculoskeletal: Positive for arthralgias, joint pain, joint swelling, myalgias, muscle weakness, morning stiffness, muscle tenderness and myalgias.  Skin: Positive for rash. Negative for color change.  Allergic/Immunologic: Negative for susceptible to infections.  Neurological: Positive for parasthesias and memory loss. Negative for dizziness, numbness and headaches.  Hematological: Negative for bruising/bleeding tendency.  Psychiatric/Behavioral: Negative for confusion.    PMFS History:  Patient Active Problem List   Diagnosis Date Noted  . Rheumatoid arthritis involving multiple sites with positive rheumatoid factor (HCC) 01/03/2020  . Trochanteric bursitis of right hip 01/03/2020  . Arthropathy of lumbar facet joint 01/03/2020  . Chronic pain syndrome 07/01/2019  . Anxiety and depression 07/01/2019  . History of gastroesophageal reflux (GERD) 07/01/2019  . History of type 2 diabetes mellitus 07/01/2019  . Essential hypertension 07/01/2019  . Elevated cholesterol 07/01/2019    Past Medical History:  Diagnosis Date  . Diabetes mellitus   . Hypertension   . Rheumatoid arthritis (HCC)     Family History  Problem Relation Age of Onset  . Diabetes Mother   . Hypertension Mother   . Rheum arthritis Mother   . Heart attack Father   . Hypertension Brother    History reviewed. No pertinent surgical history. Social History   Social History Narrative  Mi Ballae    There is no immunization history on  file for this patient.   Objective: Vital Signs: BP (!) 163/112 (BP Location: Right Arm, Patient Position: Sitting, Cuff Size: Large)   Pulse 78   Ht 5\' 5"  (1.651 m)   Wt 224 lb 12.8 oz (102 kg)   BMI 37.41 kg/m    Physical Exam Vitals and nursing note reviewed.  Constitutional:      Appearance: She is  well-developed.  HENT:     Head: Normocephalic and atraumatic.  Eyes:     Conjunctiva/sclera: Conjunctivae normal.  Pulmonary:     Effort: Pulmonary effort is normal.  Abdominal:     Palpations: Abdomen is soft.  Musculoskeletal:     Cervical back: Normal range of motion.  Skin:    General: Skin is warm and dry.     Capillary Refill: Capillary refill takes less than 2 seconds.  Neurological:     Mental Status: She is alert and oriented to person, place, and time.  Psychiatric:        Behavior: Behavior normal.      Musculoskeletal Exam: C-spine, thoracic spine, and lumbar spine good ROM.  Painful ROM of both shoulder joints.  Elbow joints good ROM with tenderness bilaterally.  Painful ROM of both wirst joints with tenderness and synovitis bilaterally.  Tenderness over several MCP and PIP joints as described below.  Hip joints have good ROM with discomfort bilaterally.  Painful ROM of both knee joints.  No warmth of knee joints noted.  Tenderness and swelling of both ankle joints, L>R.   CDAI Exam: CDAI Score: 20.3  Patient Global: 7 mm; Provider Global: 6 mm Swollen: 4 ; Tender: 21  Joint Exam 06/20/2020      Right  Left  Glenohumeral   Tender   Tender  Elbow   Tender   Tender  Wrist  Swollen Tender  Swollen Tender  MCP 2   Tender   Tender  MCP 3   Tender   Tender  MCP 4   Tender   Tender  PIP 2   Tender     PIP 3      Tender  PIP 4      Tender  Hip   Tender   Tender  Knee   Tender   Tender  Ankle  Swollen Tender  Swollen Tender     Investigation: No additional findings.  Imaging: No results found.  Recent Labs: Lab Results  Component Value Date   WBC 4.3 01/03/2020   HGB 10.9 (L) 01/03/2020   PLT 395 01/03/2020   NA 140 01/03/2020   K 3.9 01/03/2020   CL 106 01/03/2020   CO2 25 01/03/2020   GLUCOSE 81 01/03/2020   BUN 9 01/03/2020   CREATININE 0.76 01/03/2020   BILITOT 0.3 01/03/2020   ALKPHOS 61 07/01/2011   AST 13 01/03/2020   ALT 15 01/03/2020    PROT 7.2 01/03/2020   ALBUMIN 3.8 07/01/2011   CALCIUM 9.5 01/03/2020   GFRAA 105 01/03/2020   QFTBGOLDPLUS NEGATIVE 07/01/2019    Speciality Comments: No specialty comments available.  Procedures:  No procedures performed Allergies: Patient has no known allergies.     Assessment / Plan:     Visit Diagnoses: Rheumatoid arthritis involving multiple sites with positive rheumatoid factor (HCC): She has tenderness and synovitis over both wrist joints and both ankle joints.  She has persistent pain in multiple joints including both shoulders, both elbows, both wrists, both hands, both hips, both knees, and both ankle joints.  She notices intermittent  joint swelling in both knees and has difficulty ambulating at time due to severity of pain and stiffness.  She has been experiencing persistent nocturnal pain as well as difficulty with ADLs at times.  According to the patient she notices initial improvement after her weekly methotrexate injections but by the end of the week her symptoms have progressively worsened to the point of having to take Tylenol 650 mg up to 3 times a day as well as using Voltaren gel topically for pain relief.  She has been injecting methotrexate 0.8 mL sq injections once weekly, Humira 40 mg sq injections every 14 days and taking folic acid 2 mg by mouth daily.  She previously had an inadequate response to Enbrel.  According to the patient when she injects Humira most of the medication leaks down her leg so she does not feel as though she is getting enough of the medication for it to be efficacious.  She does not feel comfortable with the autoinjector and would like to discuss other oral treatment options.   Indications, contraindications, and potential side effects of Rinvoq were discussed today in detail.  All questions were addressed and consent was obtained.  We will apply for rinvoq through her insurance and notify her once its been approved.  We discussed the black box warning  for Jak inhibitors and all questions were addressed.  We discussed the importance of monitoring her blood pressure, blood sugar, and cholesterol closely.  She will remain on Metformin, Lipitor, losartan, and metoprolol as prescribed.  She has no history of DVT, PE, MI, or stroke.  She is aware of the increased risk for malignancy. She has no history of diverticulitis or GI perforations. We also discussed the importance of holding Rinvoq if she develops signs or symptoms of an infection and to resume once the infection has completely cleared.  We also discussed the importance of receiving the Shingrix vaccination.  She voiced understanding.  She will remain on methotrexate as prescribed.  She is not a good candidate for a prednisone taper at this time due to the elevation in her blood pressure.  We discussed that if her blood pressure is better controlled we can send in a prednisone taper to help alleviate the flare she is experiencing until she is able to start on Rinvoq.  She will follow-up in the office in 2 to 3 months to assess her response to combination therapy.  Counseled patient that Rinvoq is a JAK inhibitor indicated for Rheumatoid Arthritis.  Counseled patient on purpose, proper use, and adverse effects of Rinvoq.    Reviewed the most common adverse effects including infection, diarrhea, headaches.  Also reviewed rare adverse effects such as bowel injury and the need to contact us if they develop stomach pain during treatment. Counseled on the increase risk of venous thrombosis. Reviewed with patient that there is the possibility of an increased risk of malignancy but it is not well understood if this increased risk is due to the medication or the disease state.  Instructed patient that medication should be held for infection and prior to surgery.  Advised patient to avoid live vaccines. Recommend annual influenza, Pneumovax 23, Prevnar 13, and Shingrix as indicated.   Reviewed importance of routine  lab monitoring including lipid panel.  Standing orders placed. Provided patient with medication education material and answered all questions.  Patient consented to Rinvoq.  Will upload into patient's chart.  Will apply through patient's insurance and update when we receive a response.  Patient dose will be 15 mg daily.  Prescription will be sent to pharmacy pending lab results and insurance approval.  Association of heart disease with rheumatoid arthritis was discussed. Need to monitor blood pressure, cholesterol, and to exercise 30-60 minutes on daily basis was discussed.   High risk medication use - Methotrexate 0.8 ml sq injections weekly, folic acid 2 mg by mouth daily.  Applying for rinvoq 15 mg daily.  Inadequate response to Enbrel.  Difficulty with self injection with Humira autoinjector.  Patient requested to try an oral medication.  We discussed the importance of staying compliant an taking  methotrexate and rinvoq as prescribed.  CBC with differential and CMP with GFR were updated on 05/24/2020.  Lipid panel was updated on 05/24/2020.  She continues to take Lipitor as prescribed.  CBC and CMP will be updated in 1 month and every 3 months to monitor for drug toxicity.  She will continue to require lipid panel every 6 months.  TB gold was ordered today.- Plan: QuantiFERON-TB Gold Plus She has not had any recent infections.  Discussed the importance of holding MTX and Rinvoq if she develops signs or symptoms of an infection and to resume once infection has completely cleared.  She voiced understanding. Encouraged to receive the Shingrix vaccine  Screening for tuberculosis -Order for TB gold was released today.  Plan: QuantiFERON-TB Gold Plus  Trochanteric bursitis of right hip: She has tenderness to palpation over the right trochanteric bursa.  She continues to have discomfort in both hip joints.  Chondromalacia of both patellae: She has painful range of motion of both knee joints on exam.   Crepitus noted bilaterally.  No warmth but ongoing swelling was also noted.  She has difficulty ambulating at times to the severity of pain.  Arthropathy of lumbar facet joint - referred to Dr. Otelia Sergeant twice and patient no showed both times. Patient is unable to be rescheduled with Dr. Otelia Sergeant.   Vitamin D deficiency: She is taking vitamin D 50,000 units once weekly.  Abnormal SPEP: Referred to hematology but the patient no showed the office visit.  Chronic pain syndrome: She has been taking Tylenol 650 mg 3 times daily as needed and using Voltaren gel topically as needed for pain relief.  Family history of rheumatoid arthritis  Essential hypertension: Her blood pressure was extremely elevated today in the office.  She was advised to call her PCP ASAP to discuss the elevation in her blood pressure.  According to the patient she was recently started on losartan.  She continues to take metoprolol as prescribed.  She was advised to monitor blood pressure very closely.  History of type 2 diabetes mellitus: We discussed the importance of close blood glucose monitoring.  She will continue on Metformin and Victoza as prescribed.  Elevated cholesterol: She is currently taking Lipitor as prescribed.  Lipid panel was updated on 05/24/2020.  Other medical conditions are listed as follows:  Anxiety and depression  History of gastroesophageal reflux (GERD)    Orders: Orders Placed This Encounter  Procedures  . QuantiFERON-TB Gold Plus   No orders of the defined types were placed in this encounter.    Follow-Up Instructions: Return in about 3 months (around 09/19/2020) for Rheumatoid arthritis.   Gearldine Bienenstock, PA-C  Note - This record has been created using Dragon software.  Chart creation errors have been sought, but may not always  have been located. Such creation errors do not reflect on  the standard of medical care.

## 2020-06-20 ENCOUNTER — Ambulatory Visit (INDEPENDENT_AMBULATORY_CARE_PROVIDER_SITE_OTHER): Payer: 59 | Admitting: Physician Assistant

## 2020-06-20 ENCOUNTER — Other Ambulatory Visit: Payer: Self-pay

## 2020-06-20 ENCOUNTER — Encounter: Payer: Self-pay | Admitting: Physician Assistant

## 2020-06-20 ENCOUNTER — Telehealth: Payer: Self-pay

## 2020-06-20 VITALS — BP 163/112 | HR 78 | Ht 65.0 in | Wt 224.8 lb

## 2020-06-20 DIAGNOSIS — E559 Vitamin D deficiency, unspecified: Secondary | ICD-10-CM

## 2020-06-20 DIAGNOSIS — Z79899 Other long term (current) drug therapy: Secondary | ICD-10-CM | POA: Diagnosis not present

## 2020-06-20 DIAGNOSIS — M2241 Chondromalacia patellae, right knee: Secondary | ICD-10-CM | POA: Diagnosis not present

## 2020-06-20 DIAGNOSIS — M7061 Trochanteric bursitis, right hip: Secondary | ICD-10-CM

## 2020-06-20 DIAGNOSIS — R778 Other specified abnormalities of plasma proteins: Secondary | ICD-10-CM

## 2020-06-20 DIAGNOSIS — F32A Depression, unspecified: Secondary | ICD-10-CM

## 2020-06-20 DIAGNOSIS — M47816 Spondylosis without myelopathy or radiculopathy, lumbar region: Secondary | ICD-10-CM

## 2020-06-20 DIAGNOSIS — Z111 Encounter for screening for respiratory tuberculosis: Secondary | ICD-10-CM

## 2020-06-20 DIAGNOSIS — Z8261 Family history of arthritis: Secondary | ICD-10-CM

## 2020-06-20 DIAGNOSIS — Z8639 Personal history of other endocrine, nutritional and metabolic disease: Secondary | ICD-10-CM

## 2020-06-20 DIAGNOSIS — G894 Chronic pain syndrome: Secondary | ICD-10-CM

## 2020-06-20 DIAGNOSIS — E7800 Pure hypercholesterolemia, unspecified: Secondary | ICD-10-CM

## 2020-06-20 DIAGNOSIS — I10 Essential (primary) hypertension: Secondary | ICD-10-CM

## 2020-06-20 DIAGNOSIS — M0579 Rheumatoid arthritis with rheumatoid factor of multiple sites without organ or systems involvement: Secondary | ICD-10-CM

## 2020-06-20 DIAGNOSIS — M2242 Chondromalacia patellae, left knee: Secondary | ICD-10-CM

## 2020-06-20 DIAGNOSIS — Z8719 Personal history of other diseases of the digestive system: Secondary | ICD-10-CM

## 2020-06-20 DIAGNOSIS — F419 Anxiety disorder, unspecified: Secondary | ICD-10-CM

## 2020-06-20 NOTE — Telephone Encounter (Signed)
Please apply for rinvoq per Sherron Ales, PA-C. Thanks!   Consent obtained and sent to scan center.

## 2020-06-20 NOTE — Patient Instructions (Signed)
Standing Labs We placed an order today for your standing lab work.   Please have your standing labs drawn in 1 month then every 3 months   If possible, please have your labs drawn 2 weeks prior to your appointment so that the provider can discuss your results at your appointment.  We have open lab daily Monday through Thursday from 1:30-4:30 PM and Friday from 1:30-4:00 PM at the office of Dr. Shaili Deveshwar, [facility].   Please be advised, all patients with office appointments requiring lab work will take precedents over walk-in lab work.  If possible, please come for your lab work on Monday and Friday afternoons, as you may experience shorter wait times. The office is located at [address], [address], , [place] [address] No appointment is necessary.   Labs are drawn by [facility]. Please bring your co-pay at the time of your lab draw.  You may receive a bill from [facility] for your lab work.  If you wish to have your labs drawn at another location, please call the office 24 hours in advance to send orders.  If you have any questions regarding directions or hours of operation,  please call [phone].   As a reminder, please drink plenty of water prior to coming for your lab work. Thanks!  Upadacitinib Extended-release Tablets What is this medicine? UPADACITINIB (ue PAD a SYE ti nib) is a medicine that works on the immune system. This medicine is used to treat rheumatoid arthritis. This medicine may be used for other purposes; ask your health care provider or pharmacist if you have questions. COMMON BRAND NAME(S): RINVOQ What should I tell my health care provider before I take this medicine? They need to know if you have any of these conditions:  blood clots  cancer  diabetes (high blood sugar)  heart disease  high blood pressure  high cholesterol  immune system problems  infection especially a viral infection such as chickenpox, cold sores, or  herpes  infection such as tuberculosis (TB) or other bacterial, fungal or viral infection  liver disease  low blood counts (white cells, platelets, or red blood cells)  lung or breathing disease (asthma, COPD)  organ transplant  smoke tobacco cigarettes  stomach or intestine problems  stroke  an unusual or allergic reaction to upadacitinib, other medicines, foods, dyes or preservatives  pregnant or trying to get pregnant  breast-feeding How should I use this medicine? Take this medicine by mouth with water. Take it as directed on the prescription label at the same time every day. Do not cut, crush, or chew this medicine. Swallow the tablets whole. You can take it with or without food. If it upsets your stomach, take it with food. Keep taking it unless your health care provider tells you to stop. A special MedGuide will be given to you by the pharmacist with each prescription and refill. Be sure to read this information carefully each time. Talk to your health care provider about the use of this medicine in children. Special care may be needed. Overdosage: If you think you have taken too much of this medicine contact a poison control center or emergency room at once. NOTE: This medicine is only for you. Do not share this medicine with others. What if I miss a dose? If you miss a dose, take it as soon as you can. If it is almost time for your next dose, take only that dose. Do not take double or extra doses. What   may interact with this medicine? Do not take this medicine with any of the following medications:  baricitinib  tofacitinib This medicine may also interact with the following medications:  azathioprine  biologic medicines such as abatacept, adalimumab, anakinra, certolizumab, etanercept, golimumab, infliximab, rituximab, secukinumab, tocilizumab, ustekinumab  certain medicines for fungal infections like ketoconazole, itraconazole, or posaconazole  certain medicines  for seizures like carbamazepine, phenobarbital, phenytoin  clarithromycin  cyclosporine  live vaccines  medicines that lower your chance of fighting infection  rifampin  supplements, such as St. John's wort This list may not describe all possible interactions. Give your health care provider a list of all the medicines, herbs, non-prescription drugs, or dietary supplements you use. Also tell them if you smoke, drink alcohol, or use illegal drugs. Some items may interact with your medicine. What should I watch for while using this medicine? Visit your health care provider for regular checks on your progress. Tell your health care provider if your symptoms do not start to get better or if they get worse. You may need blood work done while you are taking this medicine. Avoid taking medicines that contain aspirin, acetaminophen, ibuprofen, naproxen, or ketoprofen unless instructed by your health care provider. These medicines may hide a fever. This medicine may increase your risk of getting an infection. Call your health care provider for advice if you get a fever, chills, sore throat, or other symptoms of a cold or flu. Do not treat yourself. Try to avoid being around people who are sick. Do not become pregnant while taking this medicine. Women should inform their health care provider if they wish to become pregnant or think they might be pregnant. There is potential for serious harm to an unborn child. Talk to your health care provider for more information. Do not breast-feed an infant while taking this medicine or for 6 days after stopping it. Talk to your health care provider about your risk of cancer. You may be more at risk for certain types of cancer if you take this medicine. What side effects may I notice from receiving this medicine? Side effects that you should report to your doctor or health care professional as soon as possible:  allergic reactions (skin rash, itching or hives;  swelling of the face, lips, or tongue)  blood clot (chest pain; shortness of breath; pain, swelling, or warmth in the leg)  heart attack (trouble breathing; pain or tightness in the chest, neck, back or arms; unusually weak or tired)  infection (fever, chills, cough, sore throat, pain or trouble passing urine)  light-colored stool  liver injury (dark yellow or brown urine; general ill feeling or flu-like symptoms; loss of appetite, right upper belly pain; unusually weak or tired; yellowing of the eyes or skin)  low red blood cell counts (trouble breathing; feeling faint; lightheaded, falls; unusually weak or tired)  stroke (changes in vision; confusion; trouble speaking or understanding; severe headaches; sudden numbness or weakness of the face, arm or leg; trouble walking; dizziness; loss of balance or coordination)  tears in the stomach or intestines (fever; stomach pain; sudden change in bowel habits) Side effects that usually do not require medical attention (report these to your doctor or health care professional if they continue or are bothersome):  nasal congestion (runny or stuffy nose)  nausea This list may not describe all possible side effects. Call your doctor for medical advice about side effects. You may report side effects to FDA at 1-800-FDA-1088. Where should I keep my medicine? Keep   out of the reach of children and pets. Store at room temperature between 20 and 25 degrees C (68 and 77 degrees F). Get rid of any unused medicine after the expiration date. To get rid of medicines that are no longer needed or have expired:  Take the medicine to a medicine take-back program. Check with your pharmacy or law enforcement to find a location.  If you cannot return the medicine, check the label or package insert to see if the medicine should be thrown out in the garbage or flushed down the toilet. If you are not sure, ask your health care provider. If it is safe to put it in the  trash, empty the medicine out of the container. Mix the medicine with cat litter, dirt, coffee grounds, or other unwanted substance. Seal the mixture in a bag or container. Put it in the trash. NOTE: This sheet is a summary. It may not cover all possible information. If you have questions about this medicine, talk to your doctor, pharmacist, or health care provider.  2021 Elsevier/Gold Standard (2019-11-19 12:08:20)  

## 2020-06-21 NOTE — Telephone Encounter (Signed)
Submitted a Prior Authorization request to [facility] for [facility] via Cover My Meds. Will update once we receive a response.   Key: [id number] - PA Case ID: [id number]   Complete Pro BIV in progress

## 2020-06-22 ENCOUNTER — Other Ambulatory Visit (HOSPITAL_COMMUNITY): Payer: Self-pay

## 2020-06-22 LAB — QUANTIFERON-TB GOLD PLUS
Mitogen-NIL: >10 IU/mL
NIL: 0.04 IU/mL
QuantiFERON-TB Gold Plus: NEGATIVE
TB1-NIL: 0 IU/mL
TB2-NIL: 0 IU/mL

## 2020-06-22 NOTE — Telephone Encounter (Signed)
Spoke to patient, she will call [facility] and will call office back to advise.

## 2020-06-22 NOTE — Telephone Encounter (Signed)
[employer] cancelled patient's PA request stating patient not eligible due to unpaid premium. Patient will need to contact plan. Phone# [phone]  Found no active Medicaid coverage.  If patient will be uninsured, she can apply to [facility]. Will call patient to discuss.

## 2020-06-22 NOTE — Progress Notes (Signed)
TB gold negative

## 2020-06-23 NOTE — Telephone Encounter (Signed)
Received notification from [facility] regarding a prior authorization for [facility]. Authorization has been APPROVED from 06/22/20 to 12/21/20.   Authorization # [id number]  Patient must fill through [employer] [facility] (likely [facility])  [facility] Complete Savings Card  Rx [id number] Rx [id number] Rx [id number] Rx [id number]-OHCP

## 2020-06-27 ENCOUNTER — Telehealth: Payer: Self-pay

## 2020-06-27 NOTE — Telephone Encounter (Signed)
Patient called stating she spoke with Darlene from Rinvoq who told her they have her "payment card" but don't see where the prescription was sent to the pharmacy.  Patient states they gave her a phone number for our office to call.  [phone]

## 2020-06-28 MED ORDER — RINVOQ 15 MG PO TB24: 15.0000 mg | ORAL_TABLET | Freq: Every day | ORAL | 0 refills | Status: DC

## 2020-06-28 NOTE — Telephone Encounter (Signed)
Rx for Rinvoq ER 15mg  once daily sent to [facility]. ATC patient but unable to reach and VM box is full. Sent MyChart message notifying her to start Rinvoq when next Humira would be due. Sent copay card card information with prescription and MyChart message.

## 2020-06-28 NOTE — Telephone Encounter (Addendum)
Rx for Rinvoq ER 15mg  once daily sent to [facility]. ATC patient but unable to reach and VM box is full. Sent MyChart message notifying her to start Rinvoq when next Humira would be due. Sent copay card card information with prescription and MyChart message.  Addendum: Patient returned call about Rinvoq. States her last Humira dose was last month. Advised that prescription was sent to [facility] and that I've sent MyChart message with copay card information.   [facility], PharmD, MPH Clinical Pharmacist (Rheumatology and Pulmonology)

## 2020-06-28 NOTE — Addendum Note (Signed)
Addended by: Murrell Redden on: 06/28/2020 08:47 AM   Modules accepted: Orders

## 2020-07-07 ENCOUNTER — Telehealth: Payer: Self-pay | Admitting: Rheumatology

## 2020-07-07 MED ORDER — PREDNISONE 5 MG PO TABS: ORAL_TABLET | ORAL | 0 refills | Status: DC

## 2020-07-07 MED ORDER — METHOTREXATE SODIUM CHEMO INJECTION (PF) 50 MG/2ML: INTRAMUSCULAR | 2 refills | Status: AC

## 2020-07-07 NOTE — Telephone Encounter (Signed)
Patient request refill on Resuvo sent to [facility] on [address]. Patient also requesting RX for Prednisone.

## 2020-07-07 NOTE — Telephone Encounter (Signed)
Next Visit: 09/27/2020  Last Visit: 06/20/2020   Last Fill: 03/07/2020   DX: Rheumatoid arthritis involving multiple sites with positive rheumatoid factor   Current Dose per office note on 06/20/2020: Methotrexate 0.8 ml sq injections weekly  Labs: CBC with differential and CMP with GFR were updated on 05/24/2020.  Okay to refill methotrexate?    Patient requested a prednisone taper at the visit on 06/20/2020 and her blood pressure was elevated. Per office note on 06/20/2020: We discussed that if her blood pressure is better controlled we can send in a prednisone taper to help alleviate the flare she is experiencing until she is able to start on Rinvoq.  Patient states she is still having pain in both wrist, both ankles, hips and back. Patient states her blood pressure was 130/81 this morning she she checked it.   Please advise on prednisone taper. Thanks!

## 2020-08-28 ENCOUNTER — Telehealth: Payer: Self-pay

## 2020-08-28 NOTE — Telephone Encounter (Signed)
Patient called and scheduled an appointment for Monday, 6/20 at 10:40 with Ladona Ridgel for back and lower leg pain.  Patient requested a return call to let her know if there is something she can take to help with the pain until her appointment.

## 2020-08-28 NOTE — Progress Notes (Deleted)
Office Visit Note  Patient: Jeanette Potts             Date of Birth: 1970/07/29           MRN: [id number]             PCP: Laruth Bouchard, MD (Inactive) Referring: No ref. provider found Visit Date: 09/04/2020 Occupation: @GUAROCC @  Subjective:  Low back pain   History of Present Illness: Jeanette Potts is a 50 y.o. female with history of seropositive rheumatoid arthritis.  She is taking rinvoq 15 mg 1 tablet by mouth daily, methotrexate 0.8 ml sq injections once weekly, and folic acid 2 mg by mouth daily.   CBC and CMP drawn on 01/03/20.  She is overdue to update CBC and CMP today. Orders released.  Her next lab work will be due in September and every 3 months.  TB gold negative on 06/20/20.    Activities of Daily Living:  Patient reports morning stiffness for *** {minute/hour:19697}.   Patient {ACTIONS;DENIES/REPORTS:21021675::"Denies"} nocturnal pain.  Difficulty dressing/grooming: {ACTIONS;DENIES/REPORTS:21021675::"Denies"} Difficulty climbing stairs: {ACTIONS;DENIES/REPORTS:21021675::"Denies"} Difficulty getting out of chair: {ACTIONS;DENIES/REPORTS:21021675::"Denies"} Difficulty using hands for taps, buttons, cutlery, and/or writing: {ACTIONS;DENIES/REPORTS:21021675::"Denies"}  No Rheumatology ROS completed.   PMFS History:  Patient Active Problem List   Diagnosis Date Noted   Rheumatoid arthritis involving multiple sites with positive rheumatoid factor (HCC) 01/03/2020   Trochanteric bursitis of right hip 01/03/2020   Arthropathy of lumbar facet joint 01/03/2020   Chronic pain syndrome 07/01/2019   Anxiety and depression 07/01/2019   History of gastroesophageal reflux (GERD) 07/01/2019   History of type 2 diabetes mellitus 07/01/2019   Essential hypertension 07/01/2019   Elevated cholesterol 07/01/2019    Past Medical History:  Diagnosis Date   Diabetes mellitus    Hypertension    Rheumatoid arthritis (HCC)     Family History  Problem Relation Age of Onset    Diabetes Mother    Hypertension Mother    Rheum arthritis Mother    Heart attack Father    Hypertension Brother    No past surgical history on file. Social History   Social History Narrative   Not on file    There is no immunization history on file for this patient.   Objective: Vital Signs: There were no vitals taken for this visit.   Physical Exam Vitals and nursing note reviewed.  Constitutional:      Appearance: She is well-developed.  HENT:     Head: Normocephalic and atraumatic.  Eyes:     Conjunctiva/sclera: Conjunctivae normal.  Cardiovascular:     Rate and Rhythm: Normal rate.  Pulmonary:     Effort: Pulmonary effort is normal.  Abdominal:     Palpations: Abdomen is soft.  Musculoskeletal:     Cervical back: Normal range of motion.  Skin:    General: Skin is warm and dry.     Capillary Refill: Capillary refill takes less than 2 seconds.  Neurological:     Mental Status: She is alert and oriented to person, place, and time.  Psychiatric:        Behavior: Behavior normal.     Musculoskeletal Exam:   CDAI Exam: CDAI Score: -- Patient Global: --; Provider Global: -- Swollen: --; Tender: -- Joint Exam 09/04/2020   No joint exam has been documented for this visit   There is currently no information documented on the homunculus. Go to the Rheumatology activity and complete the homunculus joint exam.  Investigation: No additional findings.  Imaging: No  results found.  Recent Labs: Lab Results  Component Value Date   WBC 4.3 01/03/2020   HGB 10.9 (L) 01/03/2020   PLT 395 01/03/2020   NA 140 01/03/2020   K 3.9 01/03/2020   CL 106 01/03/2020   CO2 25 01/03/2020   GLUCOSE 81 01/03/2020   BUN 9 01/03/2020   CREATININE 0.76 01/03/2020   BILITOT 0.3 01/03/2020   ALKPHOS 61 07/01/2011   AST 13 01/03/2020   ALT 15 01/03/2020   PROT 7.2 01/03/2020   ALBUMIN 3.8 07/01/2011   CALCIUM 9.5 01/03/2020   GFRAA 105 01/03/2020   QFTBGOLDPLUS NEGATIVE  06/20/2020    Speciality Comments: No specialty comments available.  Procedures:  No procedures performed Allergies: Patient has no known allergies.   Assessment / Plan:     Visit Diagnoses: No diagnosis found.  Orders: No orders of the defined types were placed in this encounter.  No orders of the defined types were placed in this encounter.   Face-to-face time spent with patient was *** minutes. Greater than 50% of time was spent in counseling and coordination of care.  Follow-Up Instructions: No follow-ups on file.   Ellen Henri, CMA  Note - This record has been created using Animal nutritionist.  Chart creation errors have been sought, but may not always  have been located. Such creation errors do not reflect on  the standard of medical care.

## 2020-08-28 NOTE — Telephone Encounter (Signed)
Please clarify if she has tried taking methocarbamol prescribed by Dr. Corliss Skains in the past?    It is difficult to make treatment recommendations without a thorough evaluation.  If her pain continues or progressively worsens she should be evaluated in the ED if she cannot come in for an earlier appointment.

## 2020-08-28 NOTE — Telephone Encounter (Signed)
I called patient, patient stated methocarbamol is not helping, patient advised to go to ED for further evaluation if pain continues or worsens, patient verbalized understanding.

## 2020-09-04 ENCOUNTER — Ambulatory Visit: Payer: 59 | Admitting: Physician Assistant

## 2020-09-23 ENCOUNTER — Other Ambulatory Visit: Payer: Self-pay | Admitting: Physician Assistant

## 2020-09-23 DIAGNOSIS — M0579 Rheumatoid arthritis with rheumatoid factor of multiple sites without organ or systems involvement: Secondary | ICD-10-CM

## 2020-09-27 ENCOUNTER — Ambulatory Visit (INDEPENDENT_AMBULATORY_CARE_PROVIDER_SITE_OTHER): Payer: 59 | Admitting: Rheumatology

## 2020-09-27 ENCOUNTER — Other Ambulatory Visit: Payer: Self-pay

## 2020-09-27 ENCOUNTER — Encounter: Payer: Self-pay | Admitting: Rheumatology

## 2020-09-27 VITALS — BP 118/74 | HR 88 | Ht 65.0 in | Wt 227.4 lb

## 2020-09-27 DIAGNOSIS — I10 Essential (primary) hypertension: Secondary | ICD-10-CM

## 2020-09-27 DIAGNOSIS — Z8639 Personal history of other endocrine, nutritional and metabolic disease: Secondary | ICD-10-CM

## 2020-09-27 DIAGNOSIS — M0579 Rheumatoid arthritis with rheumatoid factor of multiple sites without organ or systems involvement: Secondary | ICD-10-CM | POA: Diagnosis not present

## 2020-09-27 DIAGNOSIS — Z79899 Other long term (current) drug therapy: Secondary | ICD-10-CM | POA: Diagnosis not present

## 2020-09-27 DIAGNOSIS — G894 Chronic pain syndrome: Secondary | ICD-10-CM

## 2020-09-27 DIAGNOSIS — M2241 Chondromalacia patellae, right knee: Secondary | ICD-10-CM

## 2020-09-27 DIAGNOSIS — E7800 Pure hypercholesterolemia, unspecified: Secondary | ICD-10-CM

## 2020-09-27 DIAGNOSIS — F32A Depression, unspecified: Secondary | ICD-10-CM

## 2020-09-27 DIAGNOSIS — M47816 Spondylosis without myelopathy or radiculopathy, lumbar region: Secondary | ICD-10-CM

## 2020-09-27 DIAGNOSIS — F419 Anxiety disorder, unspecified: Secondary | ICD-10-CM

## 2020-09-27 DIAGNOSIS — M2242 Chondromalacia patellae, left knee: Secondary | ICD-10-CM

## 2020-09-27 DIAGNOSIS — M7061 Trochanteric bursitis, right hip: Secondary | ICD-10-CM | POA: Diagnosis not present

## 2020-09-27 DIAGNOSIS — Z8719 Personal history of other diseases of the digestive system: Secondary | ICD-10-CM

## 2020-09-27 DIAGNOSIS — Z8261 Family history of arthritis: Secondary | ICD-10-CM

## 2020-09-27 DIAGNOSIS — E559 Vitamin D deficiency, unspecified: Secondary | ICD-10-CM

## 2020-09-27 DIAGNOSIS — R778 Other specified abnormalities of plasma proteins: Secondary | ICD-10-CM

## 2020-09-27 MED ORDER — FOLIC ACID 1 MG PO TABS: 2.0000 mg | ORAL_TABLET | Freq: Every day | ORAL | 2 refills | Status: AC

## 2020-09-27 NOTE — Patient Instructions (Addendum)
Standing Labs We placed an order today for your standing lab work.   Please have your standing labs drawn in October and every  3 months  If possible, please have your labs drawn 2 weeks prior to your appointment so that the provider can discuss your results at your appointment.  Please note that you may see your imaging and lab results in MyChart before we have reviewed them. We may be awaiting multiple results to interpret others before contacting you. Please allow our office up to 72 hours to thoroughly review all of the results before contacting the office for clarification of your results.  We have open lab daily: Monday through Thursday from 1:30-4:30 PM and Friday from 1:30-4:00 PM at the office of Dr. Pollyann Savoy, [facility].   Please be advised, all patients with office appointments requiring lab work will take precedent over walk-in lab work.  If possible, please come for your lab work on Monday and Friday afternoons, as you may experience shorter wait times. The office is located at [address], [address], [place], [place] [address] No appointment is necessary.   Labs are drawn by [facility]. Please bring your co-pay at the time of your lab draw.  You may receive a bill from [facility] for your lab work.  If you wish to have your labs drawn at another location, please call the office 24 hours in advance to send orders.  If you have any questions regarding directions or hours of operation,  please call [phone].   As a reminder, please drink plenty of water prior to coming for your lab work. Thanks!   Vaccines You are taking a medication(s) that can suppress your immune system.  The following immunizations are recommended: Flu annually Covid-19  Td/Tdap (tetanus, diphtheria, pertussis) every 10 years Pneumonia (Prevnar 15 then Pneumovax 23 at least 1 year apart.  Alternatively, can take Prevnar 20 without needing additional dose) Shingrix (after age 52): 2  doses from 4 weeks to 6 months apart  Please check with your PCP to make sure you are up to date.   If you test POSITIVE for COVID19 and have MILD to MODERATE symptoms: First, call your PCP if you would like to receive COVID19 treatment AND Hold your medications during the infection and for at least 1 week after your symptoms have resolved: Injectable medication (Benlysta, Cimzia, Cosentyx, Enbrel, Humira, Orencia, Remicade, Simponi, Stelara, Taltz, Tremfya) Methotrexate Leflunomide (Arava) Mycophenolate (Cellcept) Harriette Ohara, Olumiant, or Rinvoq If you take Actemra or Kevzara, you DO NOT need to hold these for COVID19 infection.  If you test POSITIVE for COVID19 and have NO symptoms: First, call your PCP if you would like to receive COVID19 treatment AND Hold your medications for at least 10 days after the day that you tested positive Injectable medication (Benlysta, Cimzia, Cosentyx, Enbrel, Humira, Orencia, Remicade, Simponi, Stelara, Taltz, Tremfya) Methotrexate Leflunomide (Arava) Mycophenolate (Cellcept) Harriette Ohara, Olumiant, or Rinvoq If you take Actemra or Kevzara, you DO NOT need to hold these for COVID19 infection.  If you have signs or symptoms of an infection or start antibiotics: First, call your PCP for workup of your infection. Hold your medication through the infection, until you complete your antibiotics, and until symptoms resolve if you take the following: Injectable medication (Actemra, Benlysta, Cimzia, Cosentyx, Enbrel, Humira, Kevzara, Orencia, Remicade, Simponi, Stelara, Taltz, Tremfya) Methotrexate Leflunomide (Arava) Mycophenolate (Cellcept) Osborne Oman, or Rinvoq  Heart Disease Prevention   Your inflammatory disease increases your risk of heart disease which  includes heart attack, stroke, atrial fibrillation (irregular heartbeats), high blood pressure, heart failure and atherosclerosis (plaque in the arteries).  It is important to reduce your risk by:    Keep blood pressure, cholesterol, and blood sugar at healthy levels   Smoking Cessation   Maintain a healthy weight  BMI 20-25   Eat a healthy diet  Plenty of fresh fruit, vegetables, and whole grains  Limit saturated fats, foods high in sodium, and added sugars  DASH and Mediterranean diet   Increase physical activity  Recommend moderate physically activity for 150 minutes per week/ 30 minutes a day for five days a week These can be broken up into three separate ten-minute sessions during the day.   Reduce Stress  Meditation, slow breathing exercises, yoga, coloring books  Dental visits twice a year     Please take folic acid 1 mg, 2 tablets daily while you are on methotrexate.

## 2020-09-27 NOTE — Progress Notes (Signed)
Office Visit Note  Patient: Jeanette Potts             Date of Birth: 1970-10-22           MRN: [id number]             PCP: Laruth Bouchard, MD (Inactive) Referring: No ref. provider found Visit Date: 09/27/2020 Occupation: @GUAROCC @  Subjective:  Pain and discomfort in multiple joints.   History of Present Illness: Jeanette Potts is a 50 y.o. female with history of seropositive rheumatoid arthritis.  She was a started on Rinvoq 15 mg p.o. daily on June 28, 2020.  She has been on combination of Rinvoq and methotrexate.  She was also given a prednisone taper which she stopped approximately a month ago.  She states she has been having increased pain and discomfort since she discontinued naproxen.  She complains of pain and discomfort in her bilateral wrist joints and her bilateral ankles.  She also has lower back pain.  She has been experiencing swelling in her ankles.  Activities of Daily Living:  Patient reports morning stiffness for 2-2.5 hours.   Patient Reports nocturnal pain.  Difficulty dressing/grooming: Denies Difficulty climbing stairs: Reports Difficulty getting out of chair: Reports Difficulty using hands for taps, buttons, cutlery, and/or writing: Reports  Review of Systems  Constitutional:  Positive for fatigue.  HENT:  Negative for mouth sores, mouth dryness and nose dryness.   Eyes:  Positive for dryness. Negative for pain, itching and visual disturbance.  Respiratory:  Negative for cough, hemoptysis, shortness of breath and difficulty breathing.   Cardiovascular:  Positive for swelling in legs/feet. Negative for chest pain and palpitations.  Gastrointestinal:  Negative for abdominal pain, blood in stool, constipation and diarrhea.  Endocrine: Negative for increased urination.  Genitourinary:  Negative for painful urination.  Musculoskeletal:  Positive for joint pain, joint pain, joint swelling and morning stiffness. Negative for myalgias, muscle weakness, muscle  tenderness and myalgias.  Skin:  Positive for rash. Negative for color change and redness.  Allergic/Immunologic: Negative for susceptible to infections.  Neurological:  Positive for weakness. Negative for dizziness, numbness, headaches and memory loss.  Hematological:  Negative for swollen glands.  Psychiatric/Behavioral:  Positive for sleep disturbance. Negative for confusion.    PMFS History:  Patient Active Problem List   Diagnosis Date Noted  . Rheumatoid arthritis involving multiple sites with positive rheumatoid factor (HCC) 01/03/2020  . Trochanteric bursitis of right hip 01/03/2020  . Arthropathy of lumbar facet joint 01/03/2020  . Chronic pain syndrome 07/01/2019  . Anxiety and depression 07/01/2019  . History of gastroesophageal reflux (GERD) 07/01/2019  . History of type 2 diabetes mellitus 07/01/2019  . Essential hypertension 07/01/2019  . Elevated cholesterol 07/01/2019    Past Medical History:  Diagnosis Date  . Diabetes mellitus   . Hypertension   . Rheumatoid arthritis (HCC)     Family History  Problem Relation Age of Onset  . Diabetes Mother   . Hypertension Mother   . Rheum arthritis Mother   . Heart attack Father   . Hypertension Brother    History reviewed. No pertinent surgical history. Social History   Social History Narrative  . Not on file    There is no immunization history on file for this patient.   Objective: Vital Signs: BP 118/74 (BP Location: Left Arm, Patient Position: Sitting, Cuff Size: Large)   Pulse 88   Ht 5\' 5"  (1.651 m)   Wt 227 lb 6.4 oz (103.1 kg)  BMI 37.84 kg/m    Physical Exam Vitals and nursing note reviewed.  Constitutional:      Appearance: She is well-developed.  HENT:     Head: Normocephalic and atraumatic.  Eyes:     Conjunctiva/sclera: Conjunctivae normal.  Cardiovascular:     Rate and Rhythm: Normal rate and regular rhythm.     Heart sounds: Normal heart sounds.  Pulmonary:     Effort: Pulmonary  effort is normal.     Breath sounds: Normal breath sounds.  Abdominal:     General: Bowel sounds are normal.     Palpations: Abdomen is soft.  Musculoskeletal:     Cervical back: Normal range of motion.  Lymphadenopathy:     Cervical: No cervical adenopathy.  Skin:    General: Skin is warm and dry.     Capillary Refill: Capillary refill takes less than 2 seconds.  Neurological:     Mental Status: She is alert and oriented to person, place, and time.  Psychiatric:        Behavior: Behavior normal.     Musculoskeletal Exam: C-spine was in good range of motion.  She continues to have lower back pain and discomfort.  Shoulder joints, elbow joints, wrist joints, MCPs, PIPs and DIPs with good range of motion with no synovitis.  Hip joints, knee joints, ankles, MTPs and PIPs with good range of motion with no synovitis.  She has tenderness on palpation of her right ankle joints and mild pedal edema.  CDAI Exam: CDAI Score: 0.8  Patient Global: 6 mm; Provider Global: 2 mm Swollen: 0 ; Tender: 3  Joint Exam 09/27/2020      Right  Left  Lumbar Spine   Tender     Ankle   Tender   Tender     Investigation: No additional findings.  Imaging: No results found.  Recent Labs: Lab Results  Component Value Date   WBC 4.3 01/03/2020   HGB 10.9 (L) 01/03/2020   PLT 395 01/03/2020   NA 140 01/03/2020   K 3.9 01/03/2020   CL 106 01/03/2020   CO2 25 01/03/2020   GLUCOSE 81 01/03/2020   BUN 9 01/03/2020   CREATININE 0.76 01/03/2020   BILITOT 0.3 01/03/2020   ALKPHOS 61 07/01/2011   AST 13 01/03/2020   ALT 15 01/03/2020   PROT 7.2 01/03/2020   ALBUMIN 3.8 07/01/2011   CALCIUM 9.5 01/03/2020   GFRAA 105 01/03/2020   QFTBGOLDPLUS NEGATIVE 06/20/2020    Speciality Comments: Rinvoq is started June 28, 2020 Enbrel, Humira inadequate response  Procedures:  No procedures performed Allergies: Patient has no known allergies.   Assessment / Plan:     Visit Diagnoses: Rheumatoid  arthritis involving multiple sites with positive rheumatoid factor (HCC) - Inadequate response to Enbrel.  Difficulty with self injection with Humira autoinjector.  She was placed on Rinvoq in April 2022.  She has been on combination methotrexate and Rinvoq since then.  She states she continues to have discomfort and pain in multiple joints.  She complains of discomfort in the bilateral wrist joints and bilateral ankles.  No synovitis was noted.- Plan: Sedimentation rate  High risk medication use - Methotrexate 0.8 ml sq injections weekly, folic acid 2 mg by mouth daily, rinvoq 15 mg daily. - Plan: CBC with Differential/Platelet, COMPLETE METABOLIC PANEL WITH GFR today and then every 3 months to monitor for drug toxicity.  TB gold was negative on June 20, 2020.  She has been advised to stop methotrexate  and Rinvoq in case she develops an infection and resume medications when the infection resolves.  Information regarding future immunization was also given.  Trochanteric bursitis of right hip-she continues to have some discomfort.  Stretching exercises were discussed.  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  Arthropathy of lumbar facet joint - referred to Dr. Otelia Sergeant twice and patient no showed both times. Patient is unable to be rescheduled with Dr. Otelia Sergeant.  -She continues to have lower back pain.  We will refer her to pain management.  Plan: Ambulatory referral to Physical Medicine Rehab  Vitamin D deficiency-she was treated with vitamin D in the past.  She was advised over-the-counter supplement.  Abnormal SPEP-patient did not keep appointment with hematology.  Chronic pain syndrome -she continues to have generalized pain and discomfort.  I will refer her to pain management.  Plan: Ambulatory referral to Physical Medicine Rehab  Family history of rheumatoid arthritis  Elevated cholesterol  Essential hypertension-blood pressure is normal today.  History of type 2 diabetes  mellitus  Anxiety and depression  History of gastroesophageal reflux (GERD)  Orders: Orders Placed This Encounter  Procedures  . CBC with Differential/Platelet  . COMPLETE METABOLIC PANEL WITH GFR  . Sedimentation rate  . Ambulatory referral to Physical Medicine Rehab   Meds ordered this encounter  Medications  . folic acid (FOLVITE) 1 MG tablet    Sig: Take 2 tablets (2 mg total) by mouth daily.    Dispense:  180 tablet    Refill:  2      Follow-Up Instructions: Return in about 3 months (around 12/28/2020) for Rheumatoid arthritis.   Pollyann Savoy, MD  Note - This record has been created using Animal nutritionist.  Chart creation errors have been sought, but may not always  have been located. Such creation errors do not reflect on  the standard of medical care.

## 2020-09-28 LAB — CBC WITH DIFFERENTIAL/PLATELET
Absolute Monocytes: 502 cells/uL (ref 200–950)
Basophils Absolute: 44 cells/uL (ref 0–200)
Basophils Relative: 0.5 %
Eosinophils Absolute: 114 cells/uL (ref 15–500)
Eosinophils Relative: 1.3 %
HCT: 33.7 % — ABNORMAL LOW (ref 35.0–45.0)
Hemoglobin: 10.4 g/dL — ABNORMAL LOW (ref 11.7–15.5)
Lymphs Abs: 2904 cells/uL (ref 850–3900)
MCH: 21.7 pg — ABNORMAL LOW (ref 27.0–33.0)
MCHC: 30.9 g/dL — ABNORMAL LOW (ref 32.0–36.0)
MCV: 70.4 fL — ABNORMAL LOW (ref 80.0–100.0)
MPV: 9.9 fL (ref 7.5–12.5)
Monocytes Relative: 5.7 %
Neutro Abs: 5236 cells/uL (ref 1500–7800)
Neutrophils Relative %: 59.5 %
Platelets: 458 10*3/uL — ABNORMAL HIGH (ref 140–400)
RBC: 4.79 10*6/uL (ref 3.80–5.10)
RDW: 20.8 % — ABNORMAL HIGH (ref 11.0–15.0)
Total Lymphocyte: 33 %
WBC: 8.8 10*3/uL (ref 3.8–10.8)

## 2020-09-28 LAB — COMPLETE METABOLIC PANEL WITH GFR
AG Ratio: 1.1 (calc) (ref 1.0–2.5)
ALT: 13 U/L (ref 6–29)
AST: 12 U/L (ref 10–35)
Albumin: 3.9 g/dL (ref 3.6–5.1)
Alkaline phosphatase (APISO): 65 U/L (ref 37–153)
BUN: 11 mg/dL (ref 7–25)
CO2: 28 mmol/L (ref 20–32)
Calcium: 9.2 mg/dL (ref 8.6–10.4)
Chloride: 104 mmol/L (ref 98–110)
Creat: 0.92 mg/dL (ref 0.50–1.03)
Globulin: 3.4 g/dL (calc) (ref 1.9–3.7)
Glucose, Bld: 98 mg/dL (ref 65–99)
Potassium: 3.9 mmol/L (ref 3.5–5.3)
Sodium: 141 mmol/L (ref 135–146)
Total Bilirubin: 0.2 mg/dL (ref 0.2–1.2)
Total Protein: 7.3 g/dL (ref 6.1–8.1)
eGFR: 75 mL/min/{1.73_m2} (ref >=60)

## 2020-09-28 LAB — SEDIMENTATION RATE: Sed Rate: 33 mm/h — ABNORMAL HIGH (ref 0–30)

## 2020-09-28 NOTE — Progress Notes (Signed)
Anemia noted.  Sed rate is mildly elevated most likely due to anemia.  CMP is normal.  Patient should see her PCP for the treatment of anemia.  Please forward results to her PCP.

## 2020-09-29 ENCOUNTER — Other Ambulatory Visit: Payer: Self-pay | Admitting: *Deleted

## 2020-09-29 DIAGNOSIS — M0579 Rheumatoid arthritis with rheumatoid factor of multiple sites without organ or systems involvement: Secondary | ICD-10-CM

## 2020-09-29 MED ORDER — RINVOQ 15 MG PO TB24: 15.0000 mg | ORAL_TABLET | Freq: Every day | ORAL | 0 refills | Status: DC

## 2020-09-29 NOTE — Telephone Encounter (Signed)
Ann Pharmacologist from [facility] calling for a prescription refill on Rinvoq  Next Visit: due October 2022. Message sent to the front to schedule.   Last Visit: 09/27/2020  Last Fill: 06/28/2020  DX: Rheumatoid arthritis involving multiple sites with positive rheumatoid factor  Current Dose per office note 09/27/2020: rinvoq 15 mg daily  Labs: 09/27/2020 Anemia noted.  Sed rate is mildly elevated most likely due to anemia.  CMP isnormal.   TB Gold: 06/20/2020 Neg    Okay to refill Rinvoq?

## 2020-10-18 ENCOUNTER — Encounter: Payer: Self-pay | Admitting: Physical Medicine and Rehabilitation

## 2020-12-22 ENCOUNTER — Other Ambulatory Visit (HOSPITAL_COMMUNITY): Payer: Self-pay

## 2020-12-25 ENCOUNTER — Other Ambulatory Visit: Payer: Self-pay | Admitting: Physician Assistant

## 2020-12-25 DIAGNOSIS — M0579 Rheumatoid arthritis with rheumatoid factor of multiple sites without organ or systems involvement: Secondary | ICD-10-CM

## 2020-12-25 NOTE — Telephone Encounter (Signed)
Please schedule patient for a follow up visit. Patient due October 2022. Thanks!  

## 2020-12-25 NOTE — Telephone Encounter (Signed)
Next Visit: Due October 2022. Message sent to the front to schedule.   Last Visit: 09/27/2020  Last Fill: 09/29/2020  PZ:WCHENIDPOE arthritis involving multiple sites with positive rheumatoid factor  Current Dose per office note 10/02/2020: rinvoq 15 mg daily  Labs: 09/27/2020 Anemia noted.  Sed rate is mildly elevated most likely due to anemia.  CMP is normal.    TB Gold: 06/20/2020  Neg   Okay to refill Rinvoq?

## 2020-12-26 ENCOUNTER — Other Ambulatory Visit (HOSPITAL_COMMUNITY): Payer: Self-pay

## 2021-01-01 ENCOUNTER — Encounter: Payer: 59 | Attending: Physical Medicine and Rehabilitation | Admitting: Physical Medicine and Rehabilitation

## 2021-01-02 ENCOUNTER — Ambulatory Visit: Payer: 59 | Admitting: Physician Assistant

## 2021-01-02 ENCOUNTER — Telehealth: Payer: Self-pay | Admitting: Pharmacist

## 2021-01-02 NOTE — Progress Notes (Deleted)
Office Visit Note  Patient: Jeanette Potts             Date of Birth: 03-12-1971           MRN: [id number]             PCP: Bryon Lions, PA-C Referring: Bryon Lions, PA-C Visit Date: 01/02/2021 Occupation: @GUAROCC @  Subjective:  No chief complaint on file.   History of Present Illness: Jeanette Potts is a 50 y.o. female ***   Activities of Daily Living:  Patient reports morning stiffness for *** {minute/hour:19697}.   Patient {ACTIONS;DENIES/REPORTS:21021675::"Denies"} nocturnal pain.  Difficulty dressing/grooming: {ACTIONS;DENIES/REPORTS:21021675::"Denies"} Difficulty climbing stairs: {ACTIONS;DENIES/REPORTS:21021675::"Denies"} Difficulty getting out of chair: {ACTIONS;DENIES/REPORTS:21021675::"Denies"} Difficulty using hands for taps, buttons, cutlery, and/or writing: {ACTIONS;DENIES/REPORTS:21021675::"Denies"}  No Rheumatology ROS completed.   PMFS History:  Patient Active Problem List   Diagnosis Date Noted   Rheumatoid arthritis involving multiple sites with positive rheumatoid factor (HCC) 01/03/2020   Trochanteric bursitis of right hip 01/03/2020   Arthropathy of lumbar facet joint 01/03/2020   Chronic pain syndrome 07/01/2019   Anxiety and depression 07/01/2019   History of gastroesophageal reflux (GERD) 07/01/2019   History of type 2 diabetes mellitus 07/01/2019   Essential hypertension 07/01/2019   Elevated cholesterol 07/01/2019    Past Medical History:  Diagnosis Date   Diabetes mellitus    Hypertension    Rheumatoid arthritis (HCC)     Family History  Problem Relation Age of Onset   Diabetes Mother    Hypertension Mother    Rheum arthritis Mother    Heart attack Father    Hypertension Brother    No past surgical history on file. Social History   Social History Narrative   Not on file    There is no immunization history on file for this patient.   Objective: Vital Signs: There were no vitals taken for this visit.   Physical  Exam   Musculoskeletal Exam: ***  CDAI Exam: CDAI Score: -- Patient Global: --; Provider Global: -- Swollen: --; Tender: -- Joint Exam 01/02/2021   No joint exam has been documented for this visit   There is currently no information documented on the homunculus. Go to the Rheumatology activity and complete the homunculus joint exam.  Investigation: No additional findings.  Imaging: No results found.  Recent Labs: Lab Results  Component Value Date   WBC 8.8 09/27/2020   HGB 10.4 (L) 09/27/2020   PLT 458 (H) 09/27/2020   NA 141 09/27/2020   K 3.9 09/27/2020   CL 104 09/27/2020   CO2 28 09/27/2020   GLUCOSE 98 09/27/2020   BUN 11 09/27/2020   CREATININE 0.92 09/27/2020   BILITOT 0.2 09/27/2020   ALKPHOS 61 07/01/2011   AST 12 09/27/2020   ALT 13 09/27/2020   PROT 7.3 09/27/2020   ALBUMIN 3.8 07/01/2011   CALCIUM 9.2 09/27/2020   GFRAA 105 01/03/2020   QFTBGOLDPLUS NEGATIVE 06/20/2020    Speciality Comments: Rinvoq is started June 28, 2020 Enbrel, Humira inadequate response  Procedures:  No procedures performed Allergies: Patient has no known allergies.   Assessment / Plan:     Visit Diagnoses: No diagnosis found.  Orders: No orders of the defined types were placed in this encounter.  No orders of the defined types were placed in this encounter.   Face-to-face time spent with patient was *** minutes. Greater than 50% of time was spent in counseling and coordination of care.  Follow-Up Instructions: No follow-ups on file.  Earnestine Mealing, CMA  Note - This record has been created using Editor, commissioning.  Chart creation errors have been sought, but may not always  have been located. Such creation errors do not reflect on  the standard of medical care.

## 2021-01-02 NOTE — Telephone Encounter (Signed)
Received notification from [facility] regarding a prior authorization renewal for RINVOQ. Authorization has been APPROVED from 01/01/21 to 12/31/21.   Patient can continue to fill through [employer]: [phone]  Authorization # [id number]  Chesley Mires, PharmD, MPH, BCPS Clinical Pharmacist (Rheumatology and Pulmonology)

## 2021-01-17 ENCOUNTER — Other Ambulatory Visit: Payer: Self-pay | Admitting: Rheumatology

## 2021-04-18 ENCOUNTER — Encounter: Payer: Self-pay | Admitting: *Deleted

## 2021-04-18 ENCOUNTER — Telehealth: Payer: Self-pay | Admitting: Rheumatology

## 2021-04-18 NOTE — Telephone Encounter (Signed)
Dismissal letter mailed to patient.

## 2021-04-18 NOTE — Telephone Encounter (Signed)
Patient called the office stating she needed to make an appointment. Patient has a history of no-shows and cancellations. I told the patient that due to her last appointment being a no-show that I would have to send a message back to the clinic before I could schedule her. I then told the patient we would call her when we could. Patient then got very aggressive and yelled that "getting back to her when we could" wasn't going to work and wanted to know when she could call back to schedule. I told the patient to call back in a few hours and she hung up the phone.

## 2021-04-18 NOTE — Telephone Encounter (Signed)
Please send a dismissal letter as patient is not happy and had several cancellations, no-shows.

## 2021-05-03 ENCOUNTER — Telehealth: Payer: Self-pay

## 2021-05-03 NOTE — Telephone Encounter (Signed)
I called patient, pain and swelling x  3 days, low back is warm to the touch, right hip pain Jeanette Potts, [address] [place]

## 2021-05-03 NOTE — Telephone Encounter (Signed)
Patient called stating she is still in the process of finding another Rheumatologist and is having "the worst flair imaginable."  Patient was told that we could not schedule an appointment.  Patient requested to speak with the nurse.

## 2021-05-03 NOTE — Telephone Encounter (Signed)
Rheumatoid arthritis does not affect the lumbar spine.  If she is having increased lower back pain I would recommend assessment at urgent care or by PCP.   She is not a good candidate for prednisone due to history of diabetes and long term use of celebrex.  Prednisone and NSAIDs can increase the risk for GI bleeds and GI perforation.

## 2021-05-03 NOTE — Telephone Encounter (Signed)
I called patient, patient verbalized understanding. 

## 2021-05-18 ENCOUNTER — Telehealth: Payer: Self-pay

## 2021-05-18 NOTE — Telephone Encounter (Signed)
Patient called stating she is still waiting for her PCP to refer her to a new rheumatologist.  Patient states Dr. Corliss Skainseveshwar sent her a letter letting her know that she could contact our office if she has any problems.  Patient states she has blisters and is out of her prescription of Rinvoq.   ?

## 2021-05-21 NOTE — Telephone Encounter (Signed)
Spoke with patient and advised per the dismissal letter we provide emergency care only for 30 days and it is past the 30 day mark. Patient advised to contact her PCP or urgent care.  ?

## 2021-11-20 ENCOUNTER — Other Ambulatory Visit: Payer: Self-pay | Admitting: Family

## 2021-11-20 DIAGNOSIS — M0579 Rheumatoid arthritis with rheumatoid factor of multiple sites without organ or systems involvement: Secondary | ICD-10-CM

## 2021-11-20 DIAGNOSIS — D649 Anemia, unspecified: Secondary | ICD-10-CM

## 2021-11-21 ENCOUNTER — Inpatient Hospital Stay: Payer: Commercial Managed Care - HMO | Attending: Hematology & Oncology

## 2021-11-21 ENCOUNTER — Encounter: Payer: Self-pay | Admitting: Family

## 2021-11-21 ENCOUNTER — Inpatient Hospital Stay (HOSPITAL_BASED_OUTPATIENT_CLINIC_OR_DEPARTMENT_OTHER): Payer: Commercial Managed Care - HMO | Admitting: Family

## 2021-11-21 DIAGNOSIS — D649 Anemia, unspecified: Secondary | ICD-10-CM

## 2021-11-21 DIAGNOSIS — M069 Rheumatoid arthritis, unspecified: Secondary | ICD-10-CM | POA: Insufficient documentation

## 2021-11-21 DIAGNOSIS — Z7984 Long term (current) use of oral hypoglycemic drugs: Secondary | ICD-10-CM | POA: Insufficient documentation

## 2021-11-21 DIAGNOSIS — D573 Sickle-cell trait: Secondary | ICD-10-CM | POA: Diagnosis not present

## 2021-11-21 DIAGNOSIS — E119 Type 2 diabetes mellitus without complications: Secondary | ICD-10-CM | POA: Insufficient documentation

## 2021-11-21 DIAGNOSIS — K921 Melena: Secondary | ICD-10-CM

## 2021-11-21 DIAGNOSIS — Z79899 Other long term (current) drug therapy: Secondary | ICD-10-CM | POA: Diagnosis not present

## 2021-11-21 DIAGNOSIS — D509 Iron deficiency anemia, unspecified: Secondary | ICD-10-CM | POA: Diagnosis not present

## 2021-11-21 DIAGNOSIS — I10 Essential (primary) hypertension: Secondary | ICD-10-CM | POA: Diagnosis not present

## 2021-11-21 DIAGNOSIS — Z87891 Personal history of nicotine dependence: Secondary | ICD-10-CM | POA: Diagnosis not present

## 2021-11-21 DIAGNOSIS — G4700 Insomnia, unspecified: Secondary | ICD-10-CM | POA: Insufficient documentation

## 2021-11-21 LAB — CBC WITH DIFFERENTIAL (CANCER CENTER ONLY)
Abs Immature Granulocytes: 0.04 10*3/uL (ref 0.00–0.07)
Basophils Absolute: 0 10*3/uL (ref 0.0–0.1)
Basophils Relative: 0 %
Eosinophils Absolute: 0.1 10*3/uL (ref 0.0–0.5)
Eosinophils Relative: 1 %
HCT: 25.8 % — ABNORMAL LOW (ref 36.0–46.0)
Hemoglobin: 7 g/dL — ABNORMAL LOW (ref 12.0–15.0)
Immature Granulocytes: 0 %
Lymphocytes Relative: 25 %
Lymphs Abs: 2.8 10*3/uL (ref 0.7–4.0)
MCH: 16.1 pg — ABNORMAL LOW (ref 26.0–34.0)
MCHC: 27.1 g/dL — ABNORMAL LOW (ref 30.0–36.0)
MCV: 59.4 fL — ABNORMAL LOW (ref 80.0–100.0)
Monocytes Absolute: 0.7 10*3/uL (ref 0.1–1.0)
Monocytes Relative: 6 %
Neutro Abs: 7.8 10*3/uL — ABNORMAL HIGH (ref 1.7–7.7)
Neutrophils Relative %: 68 %
Platelet Count: 593 10*3/uL — ABNORMAL HIGH (ref 150–400)
RBC: 4.34 MIL/uL (ref 3.87–5.11)
RDW: 23.3 % — ABNORMAL HIGH (ref 11.5–15.5)
Smear Review: NORMAL
WBC Count: 11.5 10*3/uL — ABNORMAL HIGH (ref 4.0–10.5)
nRBC: 0 % (ref 0.0–0.2)

## 2021-11-21 LAB — CMP (CANCER CENTER ONLY)
ALT: 9 U/L (ref 0–44)
AST: 10 U/L — ABNORMAL LOW (ref 15–41)
Albumin: 4 g/dL (ref 3.5–5.0)
Alkaline Phosphatase: 57 U/L (ref 38–126)
Anion gap: 10 (ref 5–15)
BUN: 5 mg/dL — ABNORMAL LOW (ref 6–20)
CO2: 28 mmol/L (ref 22–32)
Calcium: 9.5 mg/dL (ref 8.9–10.3)
Chloride: 102 mmol/L (ref 98–111)
Creatinine: 0.74 mg/dL (ref 0.44–1.00)
GFR, Estimated: >60 mL/min (ref >60)
Glucose, Bld: 126 mg/dL — ABNORMAL HIGH (ref 70–99)
Potassium: 3.3 mmol/L — ABNORMAL LOW (ref 3.5–5.1)
Sodium: 140 mmol/L (ref 135–145)
Total Bilirubin: 0.3 mg/dL (ref 0.3–1.2)
Total Protein: 7.7 g/dL (ref 6.5–8.1)

## 2021-11-21 LAB — LACTATE DEHYDROGENASE: LDH: 197 U/L — ABNORMAL HIGH (ref 98–192)

## 2021-11-21 LAB — RETICULOCYTES
Immature Retic Fract: 22 % — ABNORMAL HIGH (ref 2.3–15.9)
RBC.: 4.31 MIL/uL (ref 3.87–5.11)
Retic Count, Absolute: 52.6 10*3/uL (ref 19.0–186.0)
Retic Ct Pct: 1.2 % (ref 0.4–3.1)

## 2021-11-21 LAB — FERRITIN: Ferritin: 6 ng/mL — ABNORMAL LOW (ref 11–307)

## 2021-11-21 NOTE — Progress Notes (Signed)
Hematology/Oncology Consultation   Name: Jeanette Potts      MRN: [id number]    Location: Room/bed info not found  Date: 11/21/2021 Time:3:22 PM   REFERRING PHYSICIAN: Gavin Pound, MD  REASON FOR CONSULT: Anemia    DIAGNOSIS: Iron deficiency anemia   Of Note: Patient is Jehovah's Witness, no blood transfusions per patient request  HISTORY OF PRESENT ILLNESS:  Jeanette Potts is a very pleasant 51 yo African American female with long history of iron deficiency anemia.  Hgb is 7.0, MCV 59, platelets 593 and WBC count 11.5.  Ferritin is 6.  She is symptomatic with fatigue, insomnia, SOB with exertion, craving ice and baking soda, abdominal bloating/cramping, numbness and tingling in her hands and feet and puffiness in her feet and ankles.  Patient is Jehovah's witness and states that she refuses any blood products.  No known family history of anemia.  She states that she has the sickle cell trait. She is starting 2 mg Folic acid daily today.  She has never had surgery.  She has not had a colonoscopy.  She states that her Mirena has been in for over 10 years. She states that she is perimenopausal and has not had a cycle in years.  She has not noted any blood loss, bruising or petechiae.  She has 3 children and no history of miscarriage.  She has RA and currently taking Rinvoq daily and injects methotrexate once a week.  She states that she is up to date on her mammogram and her most recent result was negative.  No personal history of cancer. Her mother did have history of breast cancer.  No history of thyroid disease.  She is a type II diabetic currently taking Metformin. She also needs to refill her insulin.  No fever, n/v, cough, rash, dizziness, chest pain, palpitations or change sin bowel or bladder habits.  No falls or syncope reported.  No smoking or recreational drug use.  She rarely has an alcoholic beverage on holidays.  Appetite comes and goes. She is doing her best to stay well  hydrated. Her weight is 222 lbs.  She stays quite busy with her sweet family and also works as a Web designer.   ROS: All other 10 point review of systems is negative.   PAST MEDICAL HISTORY:   Past Medical History:  Diagnosis Date   Diabetes mellitus    Hypertension    Rheumatoid arthritis ([place])     ALLERGIES: No Known Allergies    MEDICATIONS:  Current Outpatient Medications on File Prior to Visit  Medication Sig Dispense Refill   albuterol (PROVENTIL HFA;VENTOLIN HFA) 108 (90 Base) MCG/ACT inhaler Inhale 1-2 puffs into the lungs every 6 (six) hours as needed for wheezing or shortness of breath. 1 Inhaler 0   ALPRAZolam (XANAX XR) 0.5 MG 24 hr tablet Take 0.5 mg by mouth daily.     amoxicillin (AMOXIL) 500 MG capsule Take 500 mg by mouth 2 (two) times daily.     atorvastatin (LIPITOR) 10 MG tablet Take 10 mg by mouth daily.     BD PEN NEEDLE NANO U/F 32G X 4 MM MISC SMARTSIG:1 Each SUB-Q Daily     benzonatate (TESSALON) 100 MG capsule Take by mouth.     celecoxib (CELEBREX) 100 MG capsule Take by mouth.     cetirizine (ZYRTEC) 10 MG tablet Take 10 mg by mouth as needed.      DULoxetine (CYMBALTA) 30 MG capsule Take 30 mg by mouth daily.  esomeprazole (NEXIUM) 40 MG capsule Take 40 mg by mouth daily.     fluticasone (FLONASE) 50 MCG/ACT nasal spray Place into the nose.     folic acid (FOLVITE) 1 MG tablet Take 2 tablets (2 mg total) by mouth daily. 180 tablet 2   Insulin Syringe-Needle U-100 (ADVOCATE INSULIN SYRINGE) 30G X 5/16" 1 ML MISC Use 1 syringe to inject methotrexate every 7 days. 12 each 3   losartan (COZAAR) 25 MG tablet Take 25 mg by mouth daily.     losartan-hydrochlorothiazide (HYZAAR) 100-25 MG tablet Take 1 tablet by mouth daily.     metFORMIN (GLUCOPHAGE-XR) 500 MG 24 hr tablet Take 1,000 mg by mouth 2 (two) times daily.     methocarbamol (ROBAXIN) 500 MG tablet Take 1 tablet (500 mg total) by mouth 2 (two) times daily as needed for muscle spasms. 30 tablet 0    Methotrexate Sodium (METHOTREXATE, PF,) 50 MG/2ML injection INJECT 0.8 MLS INTO THE SKIN ONCE A WEEK 8 mL 2   metoprolol tartrate (LOPRESSOR) 50 MG tablet Take 50 mg by mouth 2 (two) times daily.     omeprazole (PRILOSEC) 40 MG capsule Take by mouth.     PAXLOVID, 300/100, 20 x 150 MG & 10 x 100MG  TBPK Take 1 tablet by mouth daily as needed.     pregabalin (LYRICA) 150 MG capsule Take by mouth.     pregabalin (LYRICA) 75 MG capsule Take 75 mg by mouth 2 (two) times daily.     RINVOQ 15 MG TB24 TAKE 1 TABLET (15MG ) BY MOUTH DAILY 30 tablet 0   TUBERCULIN SYR 1CC/27GX1/2" (B-D TB SYRINGE 1CC/27GX1/2") 27G X 1/2" 1 ML MISC 12 Syringes by Does not apply route once a week. 12 each 3   valACYclovir (VALTREX) 1000 MG tablet Take two tablets at first sign of cold sore and repeat once in 12 hours.     venlafaxine XR (EFFEXOR-XR) 37.5 MG 24 hr capsule Take by mouth.     VICTOZA 18 MG/3ML SOPN Inject 1.2 mg into the skin as needed.     Vitamin D, Ergocalciferol, (DRISDOL) 1.25 MG (50000 UNIT) CAPS capsule Take 1 capsule (50,000 Units total) by mouth every 7 (seven) days. 12 capsule 0   [DISCONTINUED] enalapril (VASOTEC) 10 MG tablet Take 1 tablet (10 mg total) by mouth daily. 30 tablet 0   No current facility-administered medications on file prior to visit.     PAST SURGICAL HISTORY No past surgical history on file.  FAMILY HISTORY: Family History  Problem Relation Age of Onset   Diabetes Mother    Hypertension Mother    Rheum arthritis Mother    Heart attack Father    Hypertension Brother     SOCIAL HISTORY:  reports that she quit smoking about 2 years ago. Her smoking use included cigarettes. She has a 7.50 pack-year smoking history. She has never used smokeless tobacco. She reports that she does not drink alcohol and does not use drugs.  PERFORMANCE STATUS: The patient's performance status is 1 - Symptomatic but completely ambulatory  PHYSICAL EXAM: Most Recent Vital Signs: There were no  vitals taken for this visit. There were no vitals taken for this visit.  General Appearance:    Alert, cooperative, no distress, appears stated age  Head:    Normocephalic, without obvious abnormality, atraumatic  Eyes:    PERRL, conjunctiva/corneas clear, EOM's intact, fundi    benign, both eyes        Throat:   Lips, mucosa,  and tongue normal; teeth and gums normal  Neck:   Supple, symmetrical, trachea midline, no adenopathy;    thyroid:  no enlargement/tenderness/nodules; no carotid   bruit or JVD  Back:     Symmetric, no curvature, ROM normal, no CVA tenderness  Lungs:     Clear to auscultation bilaterally, respirations unlabored  Chest Wall:    No tenderness or deformity   Heart:    Regular rate and rhythm, S1 and S2 normal, no murmur, rub   or gallop     Abdomen:     Soft, non-tender, bowel sounds active all four quadrants,    no masses, no organomegaly        Extremities:   Extremities normal, atraumatic, no cyanosis or edema  Pulses:   2+ and symmetric all extremities  Skin:   Skin color, texture, turgor normal, no rashes or lesions  Lymph nodes:   Cervical, supraclavicular, and axillary nodes normal  Neurologic:   CNII-XII intact, normal strength, sensation and reflexes    throughout    LABORATORY DATA:  Results for orders placed or performed in visit on 11/21/21 (from the past 48 hour(s))  CBC with Differential (Cancer Center Only)     Status: Abnormal   Collection Time: 11/21/21  2:36 PM  Result Value Ref Range   WBC Count 11.5 (H) 4.0 - 10.5 K/uL   RBC 4.34 3.87 - 5.11 MIL/uL   Hemoglobin 7.0 (L) 12.0 - 15.0 g/dL    Comment: Reticulocyte Hemoglobin testing may be clinically indicated, consider ordering this additional test [id number]    HCT 25.8 (L) 36.0 - 46.0 %   MCV 59.4 (L) 80.0 - 100.0 fL   MCH 16.1 (L) 26.0 - 34.0 pg   MCHC 27.1 (L) 30.0 - 36.0 g/dL   RDW 47.8 (H) 29.5 - 62.1 %   Platelet Count 593 (H) 150 - 400 K/uL   nRBC 0.0 0.0 - 0.2 %    Neutrophils Relative % 68 %   Neutro Abs 7.8 (H) 1.7 - 7.7 K/uL   Lymphocytes Relative 25 %   Lymphs Abs 2.8 0.7 - 4.0 K/uL   Monocytes Relative 6 %   Monocytes Absolute 0.7 0.1 - 1.0 K/uL   Eosinophils Relative 1 %   Eosinophils Absolute 0.1 0.0 - 0.5 K/uL   Basophils Relative 0 %   Basophils Absolute 0.0 0.0 - 0.1 K/uL   WBC Morphology MORPHOLOGY UNREMARKABLE    Smear Review Normal platelet morphology    Immature Granulocytes 0 %   Abs Immature Granulocytes 0.04 0.00 - 0.07 K/uL   Target Cells PRESENT     Comment: Performed at [facility] at [facility], [address] [place], [place] [address]  CMP (Cancer Center only)     Status: Abnormal   Collection Time: 11/21/21  2:36 PM  Result Value Ref Range   Sodium 140 135 - 145 mmol/L   Potassium 3.3 (L) 3.5 - 5.1 mmol/L   Chloride 102 98 - 111 mmol/L   CO2 28 22 - 32 mmol/L   Glucose, Bld 126 (H) 70 - 99 mg/dL    Comment: Glucose reference range applies only to samples taken after fasting for at least 8 hours.   BUN 5 (L) 6 - 20 mg/dL   Creatinine 7.84 6.96 - 1.00 mg/dL   Calcium 9.5 8.9 - 29.5 mg/dL   Total Protein 7.7 6.5 - 8.1 g/dL   Albumin 4.0 3.5 - 5.0 g/dL  AST 10 (L) 15 - 41 U/L   ALT 9 0 - 44 U/L   Alkaline Phosphatase 57 38 - 126 U/L   Total Bilirubin 0.3 0.3 - 1.2 mg/dL   GFR, Estimated >60 >60 mL/min    Comment: (NOTE) Calculated using the CKD-EPI Creatinine Equation (2021)    Anion gap 10 5 - 15    Comment: Performed at [facility] at [facility], [address] [place], [place] [address]  Reticulocytes     Status: Abnormal   Collection Time: 11/21/21  2:37 PM  Result Value Ref Range   Retic Ct Pct 1.2 0.4 - 3.1 %   RBC. 4.31 3.87 - 5.11 MIL/uL   Retic Count, Absolute 52.6 19.0 - 186.0 K/uL   Immature Retic Fract 22.0 (H) 2.3 - 15.9 %    Comment: Performed at [facility] at [facility], [address] [place], [place]  [address]      RADIOGRAPHY: No results found.     PATHOLOGY: None  ASSESSMENT/PLAN: Ms. Gismondi is a very pleasant 51 yo African American female with long history of iron deficiency anemia and sickle cell trait.  She will start folic acid 2 mg PO daily.  We will set her up for 3 doses of IV iron.  She has supplies to bring in stool cards for occult blood testing.  Follow-up in 1 month.   All questions were answered. The patient knows to call the clinic with any problems, questions or concerns. We can certainly see the patient much sooner if necessary.   Lottie Dawson, NP

## 2021-11-22 ENCOUNTER — Encounter: Payer: Self-pay | Admitting: Family

## 2021-11-22 DIAGNOSIS — D509 Iron deficiency anemia, unspecified: Secondary | ICD-10-CM | POA: Diagnosis not present

## 2021-11-22 LAB — IRON AND IRON BINDING CAPACITY (CC-WL,HP ONLY)
Iron: 8 ug/dL — ABNORMAL LOW (ref 28–170)
Saturation Ratios: 2 % — ABNORMAL LOW (ref 10.4–31.8)
TIBC: 476 ug/dL — ABNORMAL HIGH (ref 250–450)
UIBC: 468 ug/dL — ABNORMAL HIGH (ref 148–442)

## 2021-11-22 LAB — ERYTHROPOIETIN: Erythropoietin: 625.8 m[IU]/mL — ABNORMAL HIGH (ref 2.6–18.5)

## 2021-11-23 ENCOUNTER — Inpatient Hospital Stay: Payer: Commercial Managed Care - HMO

## 2021-11-23 VITALS — BP 151/91 | HR 68 | Temp 98.0°F | Resp 16

## 2021-11-23 DIAGNOSIS — D509 Iron deficiency anemia, unspecified: Secondary | ICD-10-CM

## 2021-11-23 DIAGNOSIS — K921 Melena: Secondary | ICD-10-CM

## 2021-11-23 LAB — OCCULT BLOOD X 1 CARD TO LAB, STOOL
Fecal Occult Bld: NEGATIVE
Fecal Occult Bld: NEGATIVE
Fecal Occult Bld: NEGATIVE

## 2021-11-23 LAB — HGB SOLUBILITY: Hgb Solubility: POSITIVE — AB

## 2021-11-23 LAB — HGB FRACTIONATION CASCADE
Hgb A2: 2.9 % (ref 1.8–3.2)
Hgb A: 68 % — ABNORMAL LOW (ref 96.4–98.8)
Hgb F: 0 % (ref 0.0–2.0)
Hgb S: 29.1 % — ABNORMAL HIGH

## 2021-11-23 MED ORDER — SODIUM CHLORIDE 0.9 % IV SOLN
300.0000 mg | Freq: Once | INTRAVENOUS | Status: AC
  Administered 2021-11-23: 300 mg via INTRAVENOUS
  Filled 2021-11-23: qty 300

## 2021-11-23 MED ORDER — SODIUM CHLORIDE 0.9 % IV SOLN: Freq: Once | INTRAVENOUS | Status: AC

## 2021-11-23 NOTE — Patient Instructions (Signed)

## 2021-11-30 ENCOUNTER — Inpatient Hospital Stay: Payer: Commercial Managed Care - HMO

## 2021-11-30 VITALS — BP 157/97 | HR 60 | Temp 97.8°F | Resp 18

## 2021-11-30 DIAGNOSIS — D509 Iron deficiency anemia, unspecified: Secondary | ICD-10-CM

## 2021-11-30 MED ORDER — SODIUM CHLORIDE 0.9 % IV SOLN
300.0000 mg | Freq: Once | INTRAVENOUS | Status: AC
  Administered 2021-11-30: 300 mg via INTRAVENOUS
  Filled 2021-11-30: qty 300

## 2021-11-30 MED ORDER — SODIUM CHLORIDE 0.9 % IV SOLN: Freq: Once | INTRAVENOUS | Status: AC

## 2021-11-30 NOTE — Patient Instructions (Signed)

## 2021-12-07 ENCOUNTER — Inpatient Hospital Stay: Payer: Commercial Managed Care - HMO

## 2021-12-07 VITALS — BP 111/80 | HR 67 | Temp 98.2°F | Resp 18

## 2021-12-07 DIAGNOSIS — D509 Iron deficiency anemia, unspecified: Secondary | ICD-10-CM

## 2021-12-07 MED ORDER — SODIUM CHLORIDE 0.9 % IV SOLN: Freq: Once | INTRAVENOUS | Status: AC

## 2021-12-07 MED ORDER — SODIUM CHLORIDE 0.9 % IV SOLN
300.0000 mg | Freq: Once | INTRAVENOUS | Status: AC
  Administered 2021-12-07: 300 mg via INTRAVENOUS
  Filled 2021-12-07: qty 300

## 2021-12-07 NOTE — Patient Instructions (Signed)

## 2021-12-08 LAB — ALPHA-THALASSEMIA GENOTYPR

## 2021-12-19 ENCOUNTER — Inpatient Hospital Stay (HOSPITAL_BASED_OUTPATIENT_CLINIC_OR_DEPARTMENT_OTHER): Payer: Commercial Managed Care - HMO | Admitting: Family

## 2021-12-19 ENCOUNTER — Inpatient Hospital Stay: Payer: Commercial Managed Care - HMO | Attending: Family

## 2021-12-19 DIAGNOSIS — D563 Thalassemia minor: Secondary | ICD-10-CM | POA: Insufficient documentation

## 2021-12-19 DIAGNOSIS — D509 Iron deficiency anemia, unspecified: Secondary | ICD-10-CM | POA: Insufficient documentation

## 2021-12-19 LAB — RETICULOCYTES
Immature Retic Fract: 17.3 % — ABNORMAL HIGH (ref 2.3–15.9)
RBC.: 4.98 MIL/uL (ref 3.87–5.11)
Retic Count, Absolute: 52.8 10*3/uL (ref 19.0–186.0)
Retic Ct Pct: 1.1 % (ref 0.4–3.1)

## 2021-12-19 LAB — CBC WITH DIFFERENTIAL (CANCER CENTER ONLY)
Abs Immature Granulocytes: 0.02 10*3/uL (ref 0.00–0.07)
Basophils Absolute: 0 10*3/uL (ref 0.0–0.1)
Basophils Relative: 0 %
Eosinophils Absolute: 0 10*3/uL (ref 0.0–0.5)
Eosinophils Relative: 1 %
HCT: 34.4 % — ABNORMAL LOW (ref 36.0–46.0)
Hemoglobin: 10.1 g/dL — ABNORMAL LOW (ref 12.0–15.0)
Immature Granulocytes: 0 %
Lymphocytes Relative: 43 %
Lymphs Abs: 3.1 10*3/uL (ref 0.7–4.0)
MCH: 20 pg — ABNORMAL LOW (ref 26.0–34.0)
MCHC: 29.4 g/dL — ABNORMAL LOW (ref 30.0–36.0)
MCV: 68.3 fL — ABNORMAL LOW (ref 80.0–100.0)
Monocytes Absolute: 0.3 10*3/uL (ref 0.1–1.0)
Monocytes Relative: 4 %
Neutro Abs: 3.8 10*3/uL (ref 1.7–7.7)
Neutrophils Relative %: 52 %
Platelet Count: 433 10*3/uL — ABNORMAL HIGH (ref 150–400)
RBC: 5.04 MIL/uL (ref 3.87–5.11)
RDW: 33.4 % — ABNORMAL HIGH (ref 11.5–15.5)
WBC Count: 7.2 10*3/uL (ref 4.0–10.5)
nRBC: 0 % (ref 0.0–0.2)

## 2021-12-19 LAB — FERRITIN: Ferritin: 86 ng/mL (ref 11–307)

## 2021-12-19 NOTE — Progress Notes (Signed)
Hematology and Oncology Follow Up Visit  Jeanette Potts [id number] [birth year] 51 y.o. 12/19/2021   Principle Diagnosis:  Iron deficiency anemia  Alpha thalassemia minor trait  Of note: Patient is a Jehovah's Witness, no blood products per patient request  Current Therapy:   IV iron as indicated  Folic acid 2 mg PO daily   Interim History:  Jeanette Potts is here today for follow-up. She is dong well and notes that her energy is much improved since receiving IV iron.  Hgb is up to 10.1, MCV 68, platelets 433 and WBC count 7.2.  No blood loss noted. No bruising or petechiae.  She is taking her folic acid daily as prescribed.  No fever, chills, n/v, cough, rash, dizziness, SOB, chest pain, palpitations, abdominal pain or changes in bladder habits.  Oral iron supplement has caused constipation. She will stop taking.  No swelling, tenderness, numbness or tingling in her extremities.  No falls or syncope reported.  Appetite and hydration are good. Weight is stable at 225 lbs.   ECOG Performance Status: 1 - Symptomatic but completely ambulatory  Medications:  Allergies as of 12/19/2021   No Known Allergies      Medication List        Accurate as of December 19, 2021  3:19 PM. If you have any questions, ask your nurse or doctor.          albuterol 108 (90 Base) MCG/ACT inhaler Commonly known as: VENTOLIN HFA Inhale 1-2 puffs into the lungs every 6 (six) hours as needed for wheezing or shortness of breath.   ALPRAZolam 0.5 MG 24 hr tablet Commonly known as: XANAX XR Take 0.5 mg by mouth daily.   BD Pen Needle Nano U/F 32G X 4 MM Misc Generic drug: Insulin Pen Needle SMARTSIG:1 Each SUB-Q Daily   cetirizine 10 MG tablet Commonly known as: ZYRTEC Take 10 mg by mouth as needed.   esomeprazole 40 MG capsule Commonly known as: NEXIUM Take 40 mg by mouth daily.   fluticasone 50 MCG/ACT nasal spray Commonly known as: FLONASE Place into the nose.   folic acid 1 MG  tablet Commonly known as: FOLVITE Take 2 tablets (2 mg total) by mouth daily.   Insulin Syringe-Needle U-100 30G X 5/16" 1 ML Misc Commonly known as: Advocate Insulin Syringe Use 1 syringe to inject methotrexate every 7 days.   metFORMIN 500 MG 24 hr tablet Commonly known as: GLUCOPHAGE-XR Take 1,000 mg by mouth 2 (two) times daily.   methotrexate (PF) 50 MG/2ML injection INJECT 0.8 MLS INTO THE SKIN ONCE A WEEK   metoprolol 200 MG 24 hr tablet Commonly known as: TOPROL-XL Take 200 mg by mouth daily.   omeprazole 40 MG capsule Commonly known as: PRILOSEC Take by mouth.   pregabalin 75 MG capsule Commonly known as: LYRICA Take 75 mg by mouth 2 (two) times daily.   pregabalin 150 MG capsule Commonly known as: LYRICA Take by mouth.   Rinvoq 15 MG Tb24 Generic drug: Upadacitinib ER TAKE 1 TABLET (15MG ) BY MOUTH DAILY   TUBERCULIN SYR 1CC/27GX1/2" 27G X 1/2" 1 ML Misc Commonly known as: B-D TB SYRINGE 1CC/27GX1/2" 12 Syringes by Does not apply route once a week.   valACYclovir 1000 MG tablet Commonly known as: VALTREX Take two tablets at first sign of cold sore and repeat once in 12 hours.   Victoza 18 MG/3ML Sopn Generic drug: liraglutide Inject 1.2 mg into the skin as needed.   Vitamin D (Ergocalciferol) 1.25 MG (50000  UNIT) Caps capsule Commonly known as: DRISDOL Take 1 capsule (50,000 Units total) by mouth every 7 (seven) days.        Allergies: No Known Allergies  Past Medical History, Surgical history, Social history, and Family History were reviewed and updated.  Review of Systems: All other 10 point review of systems is negative.   Physical Exam:  vitals were not taken for this visit.   Wt Readings from Last 3 Encounters:  09/27/20 227 lb 6.4 oz (103.1 kg)  06/20/20 224 lb 12.8 oz (102 kg)  01/03/20 223 lb 3.2 oz (101.2 kg)    Ocular: Sclerae unicteric, pupils equal, round and reactive to light Ear-nose-throat: Oropharynx clear, dentition  fair Lymphatic: No cervical or supraclavicular adenopathy Lungs no rales or rhonchi, good excursion bilaterally Heart regular rate and rhythm, no murmur appreciated Abd soft, nontender, positive bowel sounds MSK no focal spinal tenderness, no joint edema Neuro: non-focal, well-oriented, appropriate affect Breasts: Deferred   Lab Results  Component Value Date   WBC 11.5 (H) 11/21/2021   HGB 7.0 (L) 11/21/2021   HCT 25.8 (L) 11/21/2021   MCV 59.4 (L) 11/21/2021   PLT 593 (H) 11/21/2021   Lab Results  Component Value Date   FERRITIN 6 (L) 11/21/2021   IRON 8 (L) 11/21/2021   TIBC 476 (H) 11/21/2021   UIBC 468 (H) 11/21/2021   IRONPCTSAT 2 (L) 11/21/2021   Lab Results  Component Value Date   RETICCTPCT 1.2 11/21/2021   RBC 4.31 11/21/2021   No results found for: "KPAFRELGTCHN", "LAMBDASER", "[facility]" Lab Results  Component Value Date   IGGSERUM 1,598 07/01/2019   IGMSERUM 219 07/01/2019   Lab Results  Component Value Date   ALBUMINELP 3.9 07/01/2019   A1GS 0.3 07/01/2019   A2GS 0.9 07/01/2019   BETS 0.5 07/01/2019   BETA2SER 0.4 07/01/2019   GAMS 1.6 07/01/2019   SPEI  07/01/2019     Comment:     . Evaluation reveals a restricted band (M-spike)  migrating in the gamma globulin region. If not already requested, Immunofixation should be considered.       Chemistry      Component Value Date/Time   NA 140 11/21/2021 1436   K 3.3 (L) 11/21/2021 1436   CL 102 11/21/2021 1436   CO2 28 11/21/2021 1436   BUN 5 (L) 11/21/2021 1436   CREATININE 0.74 11/21/2021 1436   CREATININE 0.92 09/27/2020 1630      Component Value Date/Time   CALCIUM 9.5 11/21/2021 1436   ALKPHOS 57 11/21/2021 1436   AST 10 (L) 11/21/2021 1436   ALT 9 11/21/2021 1436   BILITOT 0.3 11/21/2021 1436       Impression and Plan: Jeanette Potts is a very pleasant 51 yo African American female with long history of iron deficiency anemia and alpha thalassemia minor trait.  Iron studies are  pending. We will replace if needed.  She will continue her daily folic acid.  Follow-up in 3 months.   Lottie Dawson, NP 10/4/20233:19 PM

## 2021-12-20 LAB — IRON AND IRON BINDING CAPACITY (CC-WL,HP ONLY)
Iron: 59 ug/dL (ref 28–170)
Saturation Ratios: 17 % (ref 10.4–31.8)
TIBC: 342 ug/dL (ref 250–450)
UIBC: 283 ug/dL (ref 148–442)

## 2021-12-26 ENCOUNTER — Encounter: Payer: Self-pay | Admitting: Family

## 2022-03-22 ENCOUNTER — Inpatient Hospital Stay: Payer: Commercial Managed Care - HMO | Attending: Family

## 2022-03-22 ENCOUNTER — Inpatient Hospital Stay: Payer: Commercial Managed Care - HMO | Admitting: Family
